# Patient Record
Sex: Female | Born: 1935 | Race: Black or African American | Hispanic: No | Smoking: Former smoker
Health system: Southern US, Community
[De-identification: ages and names within clinical notes are randomized; demographics above are authoritative.]

## PROBLEM LIST (undated history)

## (undated) DIAGNOSIS — I10 Essential (primary) hypertension: Secondary | ICD-10-CM

## (undated) DIAGNOSIS — E785 Hyperlipidemia, unspecified: Secondary | ICD-10-CM

## (undated) HISTORY — PX: TOTAL HIP ARTHROPLASTY: SHX124

---

## 2017-05-10 ENCOUNTER — Emergency Department: Payer: Medicare Other

## 2017-05-10 ENCOUNTER — Emergency Department
Admission: EM | Admit: 2017-05-10 | Discharge: 2017-05-10 | Disposition: A | Discharge status: Home / Self Care | Payer: Medicare Other | Attending: Emergency Medicine | Admitting: Emergency Medicine

## 2017-05-10 ENCOUNTER — Encounter: Payer: Self-pay | Admitting: Intensive Care

## 2017-05-10 DIAGNOSIS — Z7982 Long term (current) use of aspirin: Secondary | ICD-10-CM | POA: Insufficient documentation

## 2017-05-10 DIAGNOSIS — M79601 Pain in right arm: Secondary | ICD-10-CM | POA: Insufficient documentation

## 2017-05-10 DIAGNOSIS — I10 Essential (primary) hypertension: Secondary | ICD-10-CM | POA: Diagnosis not present

## 2017-05-10 DIAGNOSIS — Z79899 Other long term (current) drug therapy: Secondary | ICD-10-CM | POA: Diagnosis not present

## 2017-05-10 DIAGNOSIS — Z87891 Personal history of nicotine dependence: Secondary | ICD-10-CM | POA: Diagnosis not present

## 2017-05-10 HISTORY — DX: Hyperlipidemia, unspecified: E78.5

## 2017-05-10 HISTORY — DX: Essential (primary) hypertension: I10

## 2017-05-10 LAB — CBC
HCT: 38.5 % (ref 35.0–47.0)
Hemoglobin: 13 g/dL (ref 12.0–16.0)
MCH: 31.6 pg (ref 26.0–34.0)
MCHC: 33.9 g/dL (ref 32.0–36.0)
MCV: 93.2 fL (ref 80.0–100.0)
Platelets: 218 10*3/uL (ref 150–440)
RBC: 4.13 MIL/uL (ref 3.80–5.20)
RDW: 13.5 % (ref 11.5–14.5)
WBC: 4.1 10*3/uL (ref 3.6–11.0)

## 2017-05-10 LAB — BASIC METABOLIC PANEL WITH GFR
Anion gap: 8 (ref 5–15)
BUN: 17 mg/dL (ref 6–20)
CO2: 24 mmol/L (ref 22–32)
Calcium: 9 mg/dL (ref 8.9–10.3)
Chloride: 109 mmol/L (ref 101–111)
Creatinine, Ser: 0.86 mg/dL (ref 0.44–1.00)
GFR calc Af Amer: >60 mL/min
GFR calc non Af Amer: 60 mL/min — ABNORMAL LOW
Glucose, Bld: 97 mg/dL (ref 65–99)
Potassium: 3.6 mmol/L (ref 3.5–5.1)
Sodium: 141 mmol/L (ref 135–145)

## 2017-05-10 LAB — TROPONIN I
Troponin I: 0.03 ng/mL
Troponin I: <0.03 ng/mL

## 2017-05-10 MED ORDER — TRAMADOL HCL 50 MG PO TABS: 50.0000 mg | ORAL_TABLET | Freq: Four times a day (QID) | ORAL | 0 refills | Status: DC | PRN

## 2017-05-10 MED ORDER — MORPHINE SULFATE (PF) 2 MG/ML IV SOLN
2.0000 mg | Freq: Once | INTRAVENOUS | Status: AC
  Administered 2017-05-10: 2 mg via INTRAVENOUS
  Filled 2017-05-10: qty 1

## 2017-05-10 MED ORDER — ASPIRIN 81 MG PO CHEW
324.0000 mg | CHEWABLE_TABLET | Freq: Once | ORAL | Status: AC
  Administered 2017-05-10: 324 mg via ORAL
  Filled 2017-05-10: qty 4

## 2017-05-10 MED ORDER — ONDANSETRON HCL 4 MG/2ML IJ SOLN
4.0000 mg | Freq: Once | INTRAMUSCULAR | Status: AC
  Administered 2017-05-10: 4 mg via INTRAVENOUS
  Filled 2017-05-10: qty 2

## 2017-05-10 MED ORDER — ACETAMINOPHEN 325 MG PO TABS
650.0000 mg | ORAL_TABLET | Freq: Once | ORAL | Status: AC
  Administered 2017-05-10: 650 mg via ORAL
  Filled 2017-05-10: qty 2

## 2017-05-10 NOTE — ED Triage Notes (Signed)
Patient arrived by EMS from home. PAtient was awoke from sleep with pain in R arm, dizziness, and nausea. A&O x4. EMS vitals  Blood pressure 197/109, blood sugar 124. Patient takes aspirin daily

## 2017-05-10 NOTE — Discharge Instructions (Addendum)
Although no certain cause was found for your right arm pain, your exam and evaluation are overall reassuring in the emergency department today.  Return to the emergency department immediately for any worsening arm pain, skin rash, weakness, numbness, tingling, passing out, chest pain, trouble breathing, fever, or any other symptoms concerning to you.

## 2017-05-10 NOTE — ED Notes (Signed)
Patient declined recheck of VS prior to discharge.

## 2017-05-10 NOTE — ED Provider Notes (Signed)
Discharge papers and tramadol prescription misplaced - I did go ahead and reprint so the patient can be discharged.   Governor Rooks, MD 05/10/17 1537

## 2017-05-10 NOTE — ED Provider Notes (Signed)
[facility] Emergency Department Provider Note ____________________________________________   I have reviewed the triage vital signs and the triage nursing note.  HISTORY  Chief Complaint Arm Pain (right)   Historian Patient  HPI Kelly Richards is a 83 y.o. female with history of htn and hyperlipidemia presents with complaint of waking up this morning an hour or two ago with right top of the arm pain -- seems to be the upper arm and shoulder, although not particularly tender or worse with palpation or range of motion.  Hx of MI 20 years ago, does not follow cardiology and does take  asa, but not today.  Pain 10-10.  Denies overuse injury.  No skin rash.  No chest pain or sob.    Past Medical History:  Diagnosis Date  . Hyperlipidemia   . Hypertension     There are no active problems to display for this patient.   History reviewed. No pertinent surgical history.  Prior to Admission medications   Medication Sig Start Date End Date Taking? Authorizing Provider  aspirin EC 81 MG tablet Take 81 mg by mouth daily.   Yes [provider]  atorvastatin (LIPITOR) 20 MG tablet Take 20 mg by mouth daily.   Yes [provider]  verapamil (VERELAN PM) 360 MG 24 hr capsule Take 360 mg by mouth at bedtime.   Yes [provider]    No Known Allergies  History reviewed. No pertinent family history.  Social History Social History  Substance Use Topics  . Smoking status: Former Games developer  . Smokeless tobacco: Never Used  . Alcohol use No    Review of Systems  Constitutional: Negative for fever. Eyes: Negative for visual changes. ENT: Negative for sore throat. Cardiovascular: Negative for chest pain. Respiratory: Negative for shortness of breath. Gastrointestinal: Negative for abdominal pain, vomiting and diarrhea. Genitourinary: Negative for dysuria. Musculoskeletal: Negative for back pain. Skin: Negative for  rash. Neurological: Negative for headache.  ____________________________________________   PHYSICAL EXAM:  VITAL SIGNS: ED Triage Vitals  Enc Vitals Group     BP 05/10/17 0754 (!) 178/97     Pulse Rate 05/10/17 0754 65     Resp 05/10/17 0754 15     Temp 05/10/17 0754 97.8 F (36.6 C)     Temp Source 05/10/17 0754 Oral     SpO2 05/10/17 0754 98 %     Weight 05/10/17 0754 165 lb (74.8 kg)     Height 05/10/17 0754  (1.6 m)     Head Circumference --      Peak Flow --      Pain Score 05/10/17 0753 10     Pain Loc --      Pain Edu? --      Excl. in GC? --      Constitutional: Alert and oriented. Well appearing and in no distress. HEENT   Head: Normocephalic and atraumatic.      Eyes: Conjunctivae are normal. Pupils equal and round.       Ears:         Nose: No congestion/rhinnorhea.   Mouth/Throat: Mucous membranes are moist.   Neck: No stridor. Cardiovascular/Chest: Normal rate, regular rhythm.  No murmurs, rubs, or gallops.  Nontender chest wall to anterior and lateral compression and palpation. Respiratory: Normal respiratory effort without tachypnea nor retractions. Breath sounds are clear and equal bilaterally. No wheezes/rales/rhonchi. Gastrointestinal: Soft. No distention, no guarding, no rebound. Nontender. Genitourinary/rectal:Deferred Musculoskeletal: Right trapezius and biceps and triceps area  tender although no obvious muscle spasm noted.  No pain with range of motion of the right upper extremity or neck.  No joint effusions.  No lower extremity tenderness.  Very trace le edema. Neurologic:  Normal speech and language. No gross or focal neurologic deficits are appreciated. Skin:  Skin is warm, dry and intact. No rash noted. Psychiatric: Mood and affect are normal. Speech and behavior are normal. Patient exhibits appropriate insight and judgment.   ____________________________________________  LABS (pertinent positives/negatives) I, Governor Rooks,  MD the attending physician have reviewed the labs noted below.  Labs Reviewed  BASIC METABOLIC PANEL - Abnormal; Notable for the following:       Result Value   GFR calc non Af Amer 60 (*)    All other components within normal limits  TROPONIN I - Abnormal; Notable for the following:    Troponin I 0.03 (*)    All other components within normal limits  CBC  TROPONIN I    ____________________________________________    EKG I, Governor Rooks, MD, the attending physician have personally viewed and interpreted all ECGs.  68 bpm.  NSR.  Narrow qrs.  Nl axis.  T waves inverted inf and laterally.  LVH with repol changes.  No prior ECG for comparison. ____________________________________________  RADIOLOGY All Xrays were viewed by me.  Imaging interpreted by Radiologist, and I, Governor Rooks, MD the attending physician have reviewed the radiologist interpretation noted below.  CXR:  IMPRESSION: No active cardiopulmonary disease. No evidence of pneumonia or pulmonary edema.  Large hiatal hernia.  Aortic atherosclerosis. __________________________________________  PROCEDURES  Procedure(s) performed: None  Critical Care performed: None  ____________________________________________   ED COURSE / ASSESSMENT AND PLAN  Pertinent labs & imaging results that were available during my care of the patient were reviewed by me and considered in my medical decision making (see chart for details).   Complaint of right shoulder pain seems most likely to be inflammatory such as musculo or nerve pain, with normal range of motion and exam other than pain at that right trapezius and upper arm. No swelling - think dvt unlikely.  Good pulses, think vascular occlusion unlikely.  Hx of CAD and other risk factors but location seems less likely ACS.  ECG with lvh and some tw inversions, but no prior for comparison.  Disucssed with pt and daughter 4 hour recheck for cardiac.  Trying aspirin and  tylenol initially for  Pain relief.  Pt reports 10-10 pain, but looks really comfortable, am trying non narcotic before going to narcotic options.  patient was still complaining of severe pain and was given 2 mg of morphine and after that reported relief of pain.  I will prescribe tramadol prescription if over-the-counter Tylenol and/or ibuprofen or not providing adequate home relief.  Repeat troponin is reviewed and reassuring and negative at less than 0.03.  CONSULTATIONS: None  Patient / Family / Caregiver informed of clinical course, medical decision-making process, and agree with plan.   I discussed return precautions, follow-up instructions, and discharge instructions with patient and/or family.  Discharge Instructions :Although no certain cause was found for your right arm pain, your exam and evaluation are overall reassuring in the emergency department today.  Return to the emergency department immediately for any worsening arm pain, skin rash, weakness, numbness, tingling, passing out, chest pain, trouble breathing, fever, or any other symptoms concerning to you.  ___________________________________________   FINAL CLINICAL IMPRESSION(S) / ED DIAGNOSES   Final diagnoses:  Right arm pain  Note: This dictation was prepared with Dragon dictation. Any transcriptional errors that result from this process are unintentional    Governor Rooks, MD 05/10/17 1313

## 2017-12-15 ENCOUNTER — Other Ambulatory Visit: Payer: Self-pay

## 2017-12-15 ENCOUNTER — Ambulatory Visit
Admission: EM | Admit: 2017-12-15 | Discharge: 2017-12-15 | Disposition: A | Discharge status: Home / Self Care | Payer: Medicare Other | Attending: Family Medicine | Admitting: Family Medicine

## 2017-12-15 ENCOUNTER — Encounter: Payer: Self-pay | Admitting: Emergency Medicine

## 2017-12-15 DIAGNOSIS — J40 Bronchitis, not specified as acute or chronic: Secondary | ICD-10-CM | POA: Diagnosis not present

## 2017-12-15 DIAGNOSIS — R5383 Other fatigue: Secondary | ICD-10-CM | POA: Diagnosis not present

## 2017-12-15 DIAGNOSIS — R059 Cough, unspecified: Secondary | ICD-10-CM

## 2017-12-15 DIAGNOSIS — R05 Cough: Secondary | ICD-10-CM

## 2017-12-15 MED ORDER — IPRATROPIUM-ALBUTEROL 0.5-2.5 (3) MG/3ML IN SOLN
3.0000 mL | Freq: Once | RESPIRATORY_TRACT | Status: AC
  Administered 2017-12-15: 3 mL via RESPIRATORY_TRACT

## 2017-12-15 MED ORDER — BENZONATATE 200 MG PO CAPS: 200.0000 mg | ORAL_CAPSULE | Freq: Three times a day (TID) | ORAL | 0 refills | Status: DC | PRN

## 2017-12-15 MED ORDER — DOXYCYCLINE HYCLATE 100 MG PO TABS: 100.0000 mg | ORAL_TABLET | Freq: Two times a day (BID) | ORAL | 0 refills | Status: DC

## 2017-12-15 NOTE — ED Provider Notes (Signed)
[facility]    CSN: [id number] Arrival date & time: 12/15/17  1713     History   Chief Complaint Chief Complaint  Patient presents with  . Cough    HPI Kelly Richards is a 84 y.o. female.   The history is provided by the patient.  URI  Presenting symptoms: cough and fatigue   Severity:  Moderate Onset quality:  Sudden Duration:  5 days Timing:  Constant Progression:  Worsening Chronicity:  New Relieved by:  Nothing Ineffective treatments:  OTC medications Associated symptoms: no sinus pain and no wheezing   Risk factors: chronic respiratory disease (former chronic smoker)   Risk factors: not elderly, no chronic cardiac disease, no chronic kidney disease, no immunosuppression, no recent illness and no recent travel     Past Medical History:  Diagnosis Date  . Hyperlipidemia   . Hypertension     There are no active problems to display for this patient.   Past Surgical History:  Procedure Laterality Date  . TOTAL HIP ARTHROPLASTY      OB History   None      Home Medications    Prior to Admission medications   Medication Sig Start Date End Date Taking? Authorizing Provider  aspirin EC 81 MG tablet Take 81 mg by mouth daily.   Yes [provider]  atorvastatin (LIPITOR) 20 MG tablet Take 20 mg by mouth daily.   Yes [provider]  verapamil (VERELAN PM) 360 MG 24 hr capsule Take 360 mg by mouth at bedtime.   Yes [provider]  benzonatate (TESSALON) 200 MG capsule Take 1 capsule (200 mg total) by mouth 3 (three) times daily as needed. 12/15/17   Payton Mccallum, MD  doxycycline (VIBRA-TABS) 100 MG tablet Take 1 tablet (100 mg total) by mouth 2 (two) times daily. 12/15/17   Payton Mccallum, MD  traMADol (ULTRAM) 50 MG tablet Take 1 tablet (50 mg total) by mouth every 6 (six) hours as needed for severe pain. 05/10/17   Governor Rooks, MD    Family History Family History  Problem Relation Age of Onset  . Cancer Father      Social History Social History   Tobacco Use  . Smoking status: Former Games developer  . Smokeless tobacco: Never Used  Substance Use Topics  . Alcohol use: Yes  . Drug use: No     Allergies   Patient has no known allergies.   Review of Systems Review of Systems  Constitutional: Positive for fatigue.  HENT: Negative for sinus pain.   Respiratory: Positive for cough. Negative for wheezing.      Physical Exam Triage Vital Signs ED Triage Vitals  Enc Vitals Group     BP 12/15/17 1733 (!) 160/106     Pulse Rate 12/15/17 1733 (!) 109     Resp 12/15/17 1727 16     Temp 12/15/17 1727 99.8 F (37.7 C)     Temp Source 12/15/17 1727 Oral     SpO2 12/15/17 1733 97 %     Weight 12/15/17 1727 165 lb (74.8 kg)     Height 12/15/17 1727  (1.676 m)     Head Circumference --      Peak Flow --      Pain Score 12/15/17 1726 10     Pain Loc --      Pain Edu? --      Excl. in GC? --    No data found.  Updated Vital Signs  BP (!) 160/106 (BP Location: Right Arm)   Pulse (!) 109   Temp 99.8 F (37.7 C) (Oral)   Resp 16   Ht  (1.676 m)   Wt 165 lb (74.8 kg)   SpO2 98%   BMI 26.63 kg/m   Visual Acuity Right Eye Distance:   Left Eye Distance:   Bilateral Distance:    Right Eye Near:   Left Eye Near:    Bilateral Near:     Physical Exam  Constitutional: She appears well-developed and well-nourished. No distress.  HENT:  Head: Normocephalic and atraumatic.  Nose: Mucosal edema and rhinorrhea present. No nose lacerations, sinus tenderness, nasal deformity, septal deviation or nasal septal hematoma. No epistaxis.  No foreign bodies. Right sinus exhibits maxillary sinus tenderness and frontal sinus tenderness. Left sinus exhibits maxillary sinus tenderness and frontal sinus tenderness.  Mouth/Throat: Uvula is midline, oropharynx is clear and moist and mucous membranes are normal. No oropharyngeal exudate.  Neck: Normal range of motion. Neck supple. No thyromegaly  present.  Cardiovascular: Normal rate, regular rhythm and normal heart sounds.  Pulmonary/Chest: Effort normal. No respiratory distress. She has wheezes (few expiratory and rhonchi diffusely). She has no rales.  Lymphadenopathy:    She has no cervical adenopathy.  Skin: She is not diaphoretic.  Nursing note and vitals reviewed.    UC Treatments / Results  Labs (all labs ordered are listed, but only abnormal results are displayed) Labs Reviewed - No data to display  EKG None  Radiology No results found.  Procedures Procedures (including critical care time)  Medications Ordered in UC Medications  ipratropium-albuterol (DUONEB) 0.5-2.5 (3) MG/3ML nebulizer solution 3 mL (3 mLs Nebulization Given 12/15/17 1753)    Initial Impression / Assessment and Plan / UC Course  I have reviewed the triage vital signs and the nursing notes.  Pertinent labs & imaging results that were available during my care of the patient were reviewed by me and considered in my medical decision making (see chart for details).      Final Clinical Impressions(s) / UC Diagnoses   Final diagnoses:  Cough  Bronchitis    ED Prescriptions    Medication Sig Dispense Auth. Provider   doxycycline (VIBRA-TABS) 100 MG tablet Take 1 tablet (100 mg total) by mouth 2 (two) times daily. 20 tablet Payton Mccallum, MD   benzonatate (TESSALON) 200 MG capsule Take 1 capsule (200 mg total) by mouth 3 (three) times daily as needed. 30 capsule Payton Mccallum, MD     1. diagnosis reviewed with patient; given duoneb with improvement of symptoms 2. rx as per orders above; reviewed possible side effects, interactions, risks and benefits  3. Follow-up prn if symptoms worsen or don't improve   Controlled Substance Prescriptions [place] Controlled Substance Registry consulted? Not Applicable   Payton Mccallum, MD 12/15/17 1843

## 2017-12-15 NOTE — ED Triage Notes (Signed)
Patient states she developed a very bad cough yesterday, chest pain and shortness of breath

## 2018-04-27 ENCOUNTER — Other Ambulatory Visit: Payer: Self-pay | Admitting: Family Medicine

## 2018-04-27 DIAGNOSIS — Z780 Asymptomatic menopausal state: Secondary | ICD-10-CM

## 2018-05-05 ENCOUNTER — Inpatient Hospital Stay: Admission: RE | Admit: 2018-05-05 | Payer: Medicare Other | Source: Ambulatory Visit

## 2018-05-11 ENCOUNTER — Other Ambulatory Visit: Payer: Medicare Other

## 2018-05-13 ENCOUNTER — Ambulatory Visit
Admission: RE | Admit: 2018-05-13 | Discharge: 2018-05-13 | Disposition: A | Discharge status: Home / Self Care | Payer: Medicare Other | Source: Ambulatory Visit | Attending: Family Medicine | Admitting: Family Medicine

## 2018-05-13 DIAGNOSIS — Z780 Asymptomatic menopausal state: Secondary | ICD-10-CM | POA: Insufficient documentation

## 2018-06-05 ENCOUNTER — Other Ambulatory Visit: Payer: Self-pay

## 2018-06-05 ENCOUNTER — Observation Stay
Admission: EM | Admit: 2018-06-05 | Discharge: 2018-06-06 | Disposition: A | Discharge status: Home / Self Care | Payer: Medicare Other | Attending: Internal Medicine | Admitting: Internal Medicine

## 2018-06-05 ENCOUNTER — Emergency Department: Payer: Medicare Other

## 2018-06-05 DIAGNOSIS — F0280 Dementia in other diseases classified elsewhere without behavioral disturbance: Secondary | ICD-10-CM | POA: Diagnosis not present

## 2018-06-05 DIAGNOSIS — I700 Atherosclerosis of aorta: Secondary | ICD-10-CM | POA: Diagnosis not present

## 2018-06-05 DIAGNOSIS — R079 Chest pain, unspecified: Secondary | ICD-10-CM | POA: Diagnosis present

## 2018-06-05 DIAGNOSIS — Z888 Allergy status to other drugs, medicaments and biological substances status: Secondary | ICD-10-CM | POA: Diagnosis not present

## 2018-06-05 DIAGNOSIS — R0789 Other chest pain: Principal | ICD-10-CM | POA: Insufficient documentation

## 2018-06-05 DIAGNOSIS — I10 Essential (primary) hypertension: Secondary | ICD-10-CM | POA: Diagnosis not present

## 2018-06-05 DIAGNOSIS — G309 Alzheimer's disease, unspecified: Secondary | ICD-10-CM | POA: Diagnosis not present

## 2018-06-05 DIAGNOSIS — K449 Diaphragmatic hernia without obstruction or gangrene: Secondary | ICD-10-CM | POA: Diagnosis not present

## 2018-06-05 DIAGNOSIS — E782 Mixed hyperlipidemia: Secondary | ICD-10-CM

## 2018-06-05 DIAGNOSIS — Z87891 Personal history of nicotine dependence: Secondary | ICD-10-CM | POA: Diagnosis not present

## 2018-06-05 DIAGNOSIS — K219 Gastro-esophageal reflux disease without esophagitis: Secondary | ICD-10-CM | POA: Diagnosis not present

## 2018-06-05 DIAGNOSIS — Z7982 Long term (current) use of aspirin: Secondary | ICD-10-CM | POA: Insufficient documentation

## 2018-06-05 DIAGNOSIS — Z79899 Other long term (current) drug therapy: Secondary | ICD-10-CM | POA: Insufficient documentation

## 2018-06-05 DIAGNOSIS — I6529 Occlusion and stenosis of unspecified carotid artery: Secondary | ICD-10-CM | POA: Diagnosis not present

## 2018-06-05 DIAGNOSIS — E785 Hyperlipidemia, unspecified: Secondary | ICD-10-CM | POA: Diagnosis not present

## 2018-06-05 DIAGNOSIS — R011 Cardiac murmur, unspecified: Secondary | ICD-10-CM

## 2018-06-05 DIAGNOSIS — I739 Peripheral vascular disease, unspecified: Secondary | ICD-10-CM

## 2018-06-05 DIAGNOSIS — F0390 Unspecified dementia without behavioral disturbance: Secondary | ICD-10-CM

## 2018-06-05 LAB — CBC
HCT: 38.9 % (ref 36.0–46.0)
Hemoglobin: 12.6 g/dL (ref 12.0–15.0)
MCH: 30.2 pg (ref 26.0–34.0)
MCHC: 32.4 g/dL (ref 30.0–36.0)
MCV: 93.3 fL (ref 80.0–100.0)
Platelets: 236 10*3/uL (ref 150–400)
RBC: 4.17 MIL/uL (ref 3.87–5.11)
RDW: 13.2 % (ref 11.5–15.5)
WBC: 5.6 10*3/uL (ref 4.0–10.5)
nRBC: 0 % (ref 0.0–0.2)

## 2018-06-05 LAB — BASIC METABOLIC PANEL
Anion gap: 6 (ref 5–15)
BUN: 17 mg/dL (ref 8–23)
CO2: 24 mmol/L (ref 22–32)
Calcium: 9.1 mg/dL (ref 8.9–10.3)
Chloride: 109 mmol/L (ref 98–111)
Creatinine, Ser: 0.9 mg/dL (ref 0.44–1.00)
GFR calc Af Amer: >60 mL/min (ref >60)
GFR calc non Af Amer: 56 mL/min — ABNORMAL LOW (ref >60)
Glucose, Bld: 107 mg/dL — ABNORMAL HIGH (ref 70–99)
Potassium: 3.5 mmol/L (ref 3.5–5.1)
Sodium: 139 mmol/L (ref 135–145)

## 2018-06-05 LAB — TROPONIN I
Troponin I: <0.03 ng/mL (ref <0.03)
Troponin I: <0.03 ng/mL (ref <0.03)
Troponin I: <0.03 ng/mL (ref <0.03)
Troponin I: <0.03 ng/mL (ref <0.03)

## 2018-06-05 MED ORDER — ENOXAPARIN SODIUM 40 MG/0.4ML ~~LOC~~ SOLN
40.0000 mg | SUBCUTANEOUS | Status: DC
  Administered 2018-06-05: 40 mg via SUBCUTANEOUS
  Filled 2018-06-05: qty 0.4

## 2018-06-05 MED ORDER — TRAMADOL HCL 50 MG PO TABS
50.0000 mg | ORAL_TABLET | Freq: Once | ORAL | Status: AC
  Administered 2018-06-05: 50 mg via ORAL
  Filled 2018-06-05: qty 1

## 2018-06-05 MED ORDER — ONDANSETRON HCL 4 MG/2ML IJ SOLN
4.0000 mg | Freq: Four times a day (QID) | INTRAMUSCULAR | Status: DC | PRN
  Administered 2018-06-05: 4 mg via INTRAVENOUS
  Filled 2018-06-05: qty 2

## 2018-06-05 MED ORDER — INFLUENZA VAC SPLIT HIGH-DOSE 0.5 ML IM SUSY
0.5000 mL | PREFILLED_SYRINGE | INTRAMUSCULAR | Status: DC
  Filled 2018-06-05: qty 0.5

## 2018-06-05 MED ORDER — ACETAMINOPHEN 325 MG PO TABS: 650.0000 mg | ORAL_TABLET | ORAL | Status: DC | PRN

## 2018-06-05 MED ORDER — GI COCKTAIL ~~LOC~~
30.0000 mL | Freq: Four times a day (QID) | ORAL | Status: DC | PRN
  Filled 2018-06-05: qty 30

## 2018-06-05 MED ORDER — NITROGLYCERIN 0.4 MG SL SUBL: 0.4000 mg | SUBLINGUAL_TABLET | SUBLINGUAL | Status: DC | PRN

## 2018-06-05 MED ORDER — VERAPAMIL HCL ER 180 MG PO TBCR
360.0000 mg | EXTENDED_RELEASE_TABLET | Freq: Every day | ORAL | Status: DC
  Administered 2018-06-05: 360 mg via ORAL
  Filled 2018-06-05 (×2): qty 2

## 2018-06-05 MED ORDER — ASPIRIN EC 325 MG PO TBEC
325.0000 mg | DELAYED_RELEASE_TABLET | Freq: Every day | ORAL | Status: DC
  Administered 2018-06-05 – 2018-06-06 (×2): 325 mg via ORAL
  Filled 2018-06-05 (×2): qty 1

## 2018-06-05 MED ORDER — METOPROLOL TARTRATE 25 MG PO TABS
12.5000 mg | ORAL_TABLET | Freq: Two times a day (BID) | ORAL | Status: DC
  Administered 2018-06-05: 12.5 mg via ORAL
  Filled 2018-06-05: qty 1

## 2018-06-05 MED ORDER — PANTOPRAZOLE SODIUM 40 MG PO TBEC
40.0000 mg | DELAYED_RELEASE_TABLET | Freq: Every day | ORAL | Status: DC
  Administered 2018-06-05 – 2018-06-06 (×2): 40 mg via ORAL
  Filled 2018-06-05 (×2): qty 1

## 2018-06-05 MED ORDER — ATORVASTATIN CALCIUM 20 MG PO TABS
20.0000 mg | ORAL_TABLET | Freq: Every day | ORAL | Status: DC
  Administered 2018-06-05 – 2018-06-06 (×2): 20 mg via ORAL
  Filled 2018-06-05 (×2): qty 1

## 2018-06-05 MED ORDER — MORPHINE SULFATE (PF) 2 MG/ML IV SOLN: 2.0000 mg | INTRAVENOUS | Status: DC | PRN

## 2018-06-05 NOTE — H&P (Signed)
[facility] [place] [facility]   PATIENT NAME: Kelly Richards    MR#:  [id number]  DATE OF BIRTH:  [birth year]  DATE OF ADMISSION:  06/05/2018  PRIMARY CARE PHYSICIAN: Titus Mould, NP   REQUESTING/REFERRING PHYSICIAN:   CHIEF COMPLAINT:   Chief Complaint  Patient presents with  . Chest Pain    HISTORY OF PRESENT ILLNESS: Kelly Richards  is a 84 y.o. female with a known history per below which also includes Alzheimer's disease, coronary artery disease, presents with acute chest pain that awoke patient from sleep around 4 AM associated with shortness of breath, ER work-up was unimpressive, patient evaluated in the emergency room, daughter at the bedside, patient is a poor historian due to dementia, patient now be admitted for acute atypical chest pain.  PAST MEDICAL HISTORY:   Past Medical History:  Diagnosis Date  . Hyperlipidemia   . Hypertension     PAST SURGICAL HISTORY:  Past Surgical History:  Procedure Laterality Date  . TOTAL HIP ARTHROPLASTY      SOCIAL HISTORY:  Social History   Tobacco Use  . Smoking status: Former Games developer  . Smokeless tobacco: Never Used  Substance Use Topics  . Alcohol use: Yes    FAMILY HISTORY:  Family History  Problem Relation Age of Onset  . Cancer Father     DRUG ALLERGIES:  Allergies  Allergen Reactions  . Lisinopril Cough    REVIEW OF SYSTEMS: Difficult to obtain given dementia  CONSTITUTIONAL: No fever, fatigue or weakness.  EYES: No blurred or double vision.  EARS, NOSE, AND THROAT: No tinnitus or ear pain.  RESPIRATORY: No cough, shortness of breath, wheezing or hemoptysis.  CARDIOVASCULAR: + chest pain,no orthopnea, edema.  GASTROINTESTINAL: No nausea, vomiting, diarrhea or abdominal pain.  GENITOURINARY: No dysuria, hematuria.  ENDOCRINE: No polyuria, nocturia,  HEMATOLOGY: No anemia, easy bruising or bleeding SKIN: No rash or lesion. MUSCULOSKELETAL: No joint pain or arthritis.    NEUROLOGIC: No tingling, numbness, weakness.  PSYCHIATRY: No anxiety or depression.   MEDICATIONS AT HOME:  Prior to Admission medications   Medication Sig Start Date End Date Taking? Authorizing Provider  aspirin EC 81 MG tablet Take 81 mg by mouth daily.   Yes [provider]  atorvastatin (LIPITOR) 20 MG tablet Take 20 mg by mouth daily.   Yes [provider]  meloxicam (MOBIC) 15 MG tablet Take 15 mg by mouth daily. 04/16/18  Yes [provider]  verapamil (VERELAN PM) 360 MG 24 hr capsule Take 360 mg by mouth at bedtime.   Yes [provider]      PHYSICAL EXAMINATION:   VITAL SIGNS: Blood pressure 118/75, pulse (!) 58, temperature 98.2 F (36.8 C), temperature source Oral, resp. rate (!) 21, height 5\' 6"  (1.676 m), weight 74.8 kg, SpO2 96 %.  GENERAL:  84 y.o.-year-old patient lying in the bed with no acute distress.  Obese, frail-appearing EYES: Pupils equal, round, reactive to light and accommodation. No scleral icterus. Extraocular muscles intact.  HEENT: Head atraumatic, normocephalic. Oropharynx and nasopharynx clear.  NECK:  Supple, no jugular venous distention. No thyroid enlargement, no tenderness.  LUNGS: Normal breath sounds bilaterally, no wheezing, rales,rhonchi or crepitation. No use of accessory muscles of respiration.  CARDIOVASCULAR: S1, S2 normal. No murmurs, rubs, or gallops.  ABDOMEN: Soft, nontender, nondistended. Bowel sounds present. No organomegaly or mass.  EXTREMITIES: No pedal edema, cyanosis, or clubbing.  NEUROLOGIC: Cranial nerves II through XII are intact. Muscle strength 5/5  in all extremities. Sensation intact. Gait not checked.  PSYCHIATRIC: The patient is confused and disoriented at baseline  SKIN: No obvious rash, lesion, or ulcer.   LABORATORY PANEL:   CBC Recent Labs  Lab 06/05/18 0951  WBC 5.6  HGB 12.6  HCT 38.9  PLT 236  MCV 93.3  MCH 30.2  MCHC 32.4  RDW 13.2    ------------------------------------------------------------------------------------------------------------------  Chemistries  Recent Labs  Lab 06/05/18 0951  NA 139  K 3.5  CL 109  CO2 24  GLUCOSE 107*  BUN 17  CREATININE 0.90  CALCIUM 9.1   ------------------------------------------------------------------------------------------------------------------ estimated creatinine clearance is 46.4 mL/min (by C-G formula based on SCr of 0.9 mg/dL). ------------------------------------------------------------------------------------------------------------------ No results for input(s): TSH, T4TOTAL, T3FREE, THYROIDAB in the last 72 hours.  Invalid input(s): FREET3   Coagulation profile No results for input(s): INR, PROTIME in the last 168 hours. ------------------------------------------------------------------------------------------------------------------- No results for input(s): DDIMER in the last 72 hours. -------------------------------------------------------------------------------------------------------------------  Cardiac Enzymes Recent Labs  Lab 06/05/18 0951 06/05/18 1240  TROPONINI <0.03 <0.03   ------------------------------------------------------------------------------------------------------------------ Invalid input(s): POCBNP  ---------------------------------------------------------------------------------------------------------------  Urinalysis No results found for: COLORURINE, APPEARANCEUR, LABSPEC, PHURINE, GLUCOSEU, HGBUR, BILIRUBINUR, KETONESUR, PROTEINUR, UROBILINOGEN, NITRITE, LEUKOCYTESUR   RADIOLOGY: Dg Chest 2 View  Result Date: 06/05/2018 CLINICAL DATA:  Chest pain. Shortness of breath. Former smoker. EXAM: CHEST - 2 VIEW COMPARISON:  05/10/2017 FINDINGS: The patient is mildly rotated to the right with grossly unchanged cardiomediastinal silhouette. A large hiatal hernia and aortic atherosclerosis are again noted. Lung volumes are  lower than on the prior study, and there is mild streaky opacity in the left lung base. No pleural effusion or pneumothorax is identified. Retained temporary epicardial pacing wires are again seen. No acute osseous abnormality is identified. IMPRESSION: 1. Low lung volumes with mild left basilar atelectasis. 2. Large hiatal hernia. Electronically Signed   By: Sebastian Ache M.D.   On: 06/05/2018 10:38    EKG: Orders placed or performed during the hospital encounter of 06/05/18  . EKG 12-Lead  . EKG 12-Lead  . ED EKG within 10 minutes  . ED EKG within 10 minutes  . EKG 12-Lead  . EKG 12-Lead  . EKG 12-Lead  . EKG 12-Lead    IMPRESSION AND PLAN: *Acute atypical chest pain Admit to regular nursing floor bed with telemetry on our ACS protocol, rule out acute cord syndrome cardiac enzymes x3 sets, aspirin, statin therapy, check lipids in the morning, beta-blocker therapy, nitrates as needed, IV morphine PRN breakthrough pain, supplemental oxygen wean as tolerated, if rules out will proceed with nuclear medicine stress testing in the morning  *Chronic dementia Stable Increase nursing care PRN, aspiration/fall/skin care precautions while in house  *Chronic GERD without esophagitis PPI daily  *Chronic benign essential hypertension Stable Continue verapamil, started on low-dose beta-blocker    All the records are reviewed and case discussed with ED provider. Management plans discussed with the patient, family and they are in agreement.  CODE STATUS:full    TOTAL TIME TAKING CARE OF THIS PATIENT: 40 minutes.    Evelena Asa Salary M.D on 06/05/2018   Between 7am to 6pm - Pager - [phone]  After 6pm go to [email or web address] - Social research officer, government  [facility]  Office  [phone]  CC: Primary care physician; White, Arlyss Repress, NP   Note: This dictation was prepared with Dragon dictation along with smaller phrase technology. Any transcriptional errors that  result from this process are unintentional.

## 2018-06-05 NOTE — Consult Note (Signed)
Cardiology Consultation:   Patient ID: Kelly Richards MRN: [id number]; DOB: Aug 17, 1930  Admit date: 06/05/2018 Date of Consult: 06/05/2018  Primary Care Provider: Titus Mould, NP Primary Cardiologist: New to [facility] Reason for consult: Arm pain, shortness of breath Physician requesting consult: Dr. Katheren Shams  Patient Profile:   Kelly Richards is a 84 y.o. female with a hx of hypertension, hyperlipidemia, carotid stenosis, large hiatal hernia by CT scan , aortic atherosclerosis by CT , who presents to the hospital with bilateral arm pain, shortness of breath   History of Present Illness:   [name] reports symptoms started several weeks to months ago, Seems to happen once a month She will develop unilateral arm pain and typically rubs her arm until symptoms resolve Last night developed bilateral arm pain at rest, woke her from sleep Symptoms seem to get better for a short period of time and went back to sleep but then woke up again with bilateral arm pain symptoms Worse that she has had so far, unable to get the pain to go away by rubbing her arm so she presented to the hospital She did call her family to let them know  Typically not very active, no regular exercise program Denies having any chest pain on exertion  EKG from emergency room evaluated personally by myself showing normal sinus rhythm with T wave abnormality precordial leads concerning for ischemia  Outside records reviewed showing moderate carotid disease on ultrasound February 2018 50-69% maximum diameter stenosis estimated in the right internal carotid artery.   Previous CT abdomen pelvis February 2018 with large hiatal hernia Aortic atherosclerosis including branches   Stress echocardiogram April 2015 Report indicates mildly abnormal stress echo T wave inversions noted at that time at rest   Past Medical History:  Diagnosis Date  . Hyperlipidemia   . Hypertension     Past Surgical History:    Procedure Laterality Date  . TOTAL HIP ARTHROPLASTY       Home Medications:  Prior to Admission medications   Medication Sig Start Date End Date Taking? Authorizing Provider  aspirin EC 81 MG tablet Take 81 mg by mouth daily.   Yes [provider]  atorvastatin (LIPITOR) 20 MG tablet Take 20 mg by mouth daily.   Yes [provider]  meloxicam (MOBIC) 15 MG tablet Take 15 mg by mouth daily. 04/16/18  Yes [provider]  verapamil (VERELAN PM) 360 MG 24 hr capsule Take 360 mg by mouth at bedtime.   Yes [provider]    Inpatient Medications: Scheduled Meds: . aspirin EC  325 mg Oral Daily  . atorvastatin  20 mg Oral Daily  . enoxaparin (LOVENOX) injection  40 mg Subcutaneous Q24H  . [START ON 06/06/2018] Influenza vac split quadrivalent PF  0.5 mL Intramuscular Tomorrow-1000  . metoprolol tartrate  12.5 mg Oral BID  . pantoprazole  40 mg Oral Daily  . verapamil  360 mg Oral QHS   Continuous Infusions:  PRN Meds: acetaminophen, gi cocktail, morphine injection, nitroGLYCERIN, ondansetron (ZOFRAN) IV  Allergies:    Allergies  Allergen Reactions  . Lisinopril Cough    Social History:   Social History   Socioeconomic History  . Marital status: Widowed    Spouse name: Not on file  . Number of children: Not on file  . Years of education: Not on file  . Highest education level: Not on file  Occupational History  . Not on file  Social Needs  . Financial resource strain: Not  on file  . Food insecurity:    Worry: Not on file    Inability: Not on file  . Transportation needs:    Medical: Not on file    Non-medical: Not on file  Tobacco Use  . Smoking status: Former Games developer  . Smokeless tobacco: Never Used  Substance and Sexual Activity  . Alcohol use: Yes  . Drug use: No  . Sexual activity: Not Currently  Lifestyle  . Physical activity:    Days per week: Not on file    Minutes per session: Not on file  . Stress: Not on file   Relationships  . Social connections:    Talks on phone: Not on file    Gets together: Not on file    Attends religious service: Not on file    Active member of club or organization: Not on file    Attends meetings of clubs or organizations: Not on file    Relationship status: Not on file  . Intimate partner violence:    Fear of current or ex partner: Not on file    Emotionally abused: Not on file    Physically abused: Not on file    Forced sexual activity: Not on file  Other Topics Concern  . Not on file  Social History Narrative  . Not on file    Family History:    Family History  Problem Relation Age of Onset  . Cancer Father      ROS:  Please see the history of present illness.  Review of Systems  Constitutional: Negative.   Respiratory: Negative.   Cardiovascular:       Bilateral arm pain  Gastrointestinal: Negative.   Musculoskeletal: Negative.   Neurological: Negative.   Psychiatric/Behavioral: Negative.   All other systems reviewed and are negative.   Physical Exam/Data:   Vitals:   06/05/18 0952 06/05/18 0959 06/05/18 1530 06/05/18 1651  BP:   132/82 (!) 146/86  Pulse:   (!) 52 (!) 55  Resp:   14 19  Temp: 98.2 F (36.8 C)   97.7 F (36.5 C)  TempSrc: Oral   Oral  SpO2:   96% 99%  Weight:  74.8 kg    Height:  5\' 6"  (1.676 m)     No intake or output data in the 24 hours ending 06/05/18 1917 Filed Weights   06/05/18 0959  Weight: 74.8 kg   Body mass index is 26.62 kg/m.  General:  Well nourished, well developed, in no acute distress HEENT: normal Lymph: no adenopathy Neck: no JVD Endocrine:  No thryomegaly Vascular: No carotid bruits; FA pulses 2+ bilaterally without bruits  Cardiac:  normal S1, S2; RRR; no murmur  Lungs:  clear to auscultation bilaterally, no wheezing, rhonchi or rales  Abd: soft, nontender, no hepatomegaly  Ext: no edema Musculoskeletal:  No deformities, BUE and BLE strength normal and equal Skin: warm and dry  Neuro:   CNs 2-12 intact, no focal abnormalities noted Psych:  Normal affect   EKG:  The EKG was personally reviewed and demonstrates: As detailed above, normal sinus rhythm with T wave inversions in anterolateral leads Telemetry:  Telemetry was personally reviewed and demonstrates: Normal sinus rhythm  Relevant CV Studies: Stress test pending  Laboratory Data:  Chemistry Recent Labs  Lab 06/05/18 0951  NA 139  K 3.5  CL 109  CO2 24  GLUCOSE 107*  BUN 17  CREATININE 0.90  CALCIUM 9.1  GFRNONAA 56*  GFRAA >60  ANIONGAP 6  No results for input(s): PROT, ALBUMIN, AST, ALT, ALKPHOS, BILITOT in the last 168 hours. Hematology Recent Labs  Lab 06/05/18 0951  WBC 5.6  RBC 4.17  HGB 12.6  HCT 38.9  MCV 93.3  MCH 30.2  MCHC 32.4  RDW 13.2  PLT 236   Cardiac Enzymes Recent Labs  Lab 06/05/18 0951 06/05/18 1240 06/05/18 1702  TROPONINI <0.03 <0.03 <0.03   No results for input(s): TROPIPOC in the last 168 hours.  BNPNo results for input(s): BNP, PROBNP in the last 168 hours.  DDimer No results for input(s): DDIMER in the last 168 hours.  Radiology/Studies:  Dg Chest 2 View  Result Date: 06/05/2018 CLINICAL DATA:  Chest pain. Shortness of breath. Former smoker. EXAM: CHEST - 2 VIEW COMPARISON:  05/10/2017 FINDINGS: The patient is mildly rotated to the right with grossly unchanged cardiomediastinal silhouette. A large hiatal hernia and aortic atherosclerosis are again noted. Lung volumes are lower than on the prior study, and there is mild streaky opacity in the left lung base. No pleural effusion or pneumothorax is identified. Retained temporary epicardial pacing wires are again seen. No acute osseous abnormality is identified. IMPRESSION: 1. Low lung volumes with mild left basilar atelectasis. 2. Large hiatal hernia. Electronically Signed   By: Sebastian Ache M.D.   On: 06/05/2018 10:38    Assessment and Plan:   1. Bilateral arm pain Reports having no significant chest  pain Stuttering, and worsening bilateral arm pain presenting at rest She does have risk factors for coronary artery disease including PAD/carotid stenosis/aortic atherosclerosis Also with abnormal EKG, unable to treadmill -Pharmacologic Myoview has been ordered by hospitalist service Procedure discussed with Ms. Hammond in detail, she is willing to proceed If stress test shows no ischemia, unable to exclude other etiology for her symptoms such as arthritis/DJD, large hiatal hernia -Would decrease aspirin down to 81 mg daily  2) dementia No family at the bedside to discuss details Notes reviewed  3) chronic GERD,  On PPI  4) PAD,  Carotid stenosis, aortic atherosclerosis Low-dose aspirin, statin  5) hypertension Continue verapamil, Hospitalist service has started beta-blocker (will likely not be able to tolerate given bradycardia)   Total encounter time more than 110 minutes  Greater than 50% was spent in counseling and coordination of care with the patient   For questions or updates, please contact [facility] Please consult [email or web address] for contact info under     Signed, Julien Nordmann, MD  06/05/2018 7:17 PM

## 2018-06-05 NOTE — Progress Notes (Signed)
Family Meeting Note  Advance Directive:yes  Today a meeting took place with the Patient, daughter.  Patient is able to participate   The following clinical team members were present during this meeting:MD  The following were discussed:Patient's diagnosis:cad, dementia , Patient's progosis: Unable to determine and Goals for treatment: Full Code  Additional follow-up to be provided: prn  Time spent during discussion:20 minutes  Bertrum Sol, MD

## 2018-06-05 NOTE — Progress Notes (Signed)
Chaplain responded to an OR for an AD. Pt is alert and oriented. Chaplain educated Pt on AD and practiced active listening and pastoral presence. Family and career were discussed and prayer was given. Pt may be D/C tomorrow. Chaplain let Pt know AD can be done outside of hospital.    06/05/18 1700  Clinical Encounter Type  Visited With Patient  Visit Type Initial  Referral From Nurse  Spiritual Encounters  Spiritual Needs Brochure;Prayer

## 2018-06-05 NOTE — ED Notes (Signed)
Report to Ashley, RN

## 2018-06-05 NOTE — ED Provider Notes (Signed)
[facility] Emergency Department Provider Note    First MD Initiated Contact with Patient 06/05/18 [id number]     (approximate)  I have reviewed the triage vital signs and the nursing notes.   HISTORY  Chief Complaint Chest Pain    HPI Kelly Richards is a 84 y.o. female presents the ER with bilateral arm pain and midsternal chest pain associated with some shortness of breath.  Patient does have a history of cardiac disease but does not remember her cardiologist or what her cardiac history is.  States the pain woke her up from sleep around 4 AM.  States it is sharp and stabbing in nature.  Pain has eased off but is still there.  Denies any diaphoresis or nausea with this.  No cough.  No fevers.  No flank pain.  No pain ripping or tearing through to her back.    Past Medical History:  Diagnosis Date  . Hyperlipidemia   . Hypertension    Family History  Problem Relation Age of Onset  . Cancer Father    Past Surgical History:  Procedure Laterality Date  . TOTAL HIP ARTHROPLASTY     Patient Active Problem List   Diagnosis Date Noted  . Chest pain 06/05/2018      Prior to Admission medications   Medication Sig Start Date End Date Taking? Authorizing Provider  aspirin EC 81 MG tablet Take 81 mg by mouth daily.   Yes [provider]  atorvastatin (LIPITOR) 20 MG tablet Take 20 mg by mouth daily.   Yes [provider]  meloxicam (MOBIC) 15 MG tablet Take 15 mg by mouth daily. 04/16/18  Yes [provider]  verapamil (VERELAN PM) 360 MG 24 hr capsule Take 360 mg by mouth at bedtime.   Yes [provider]    Allergies Lisinopril    Social History Social History   Tobacco Use  . Smoking status: Former Games developer  . Smokeless tobacco: Never Used  Substance Use Topics  . Alcohol use: Yes  . Drug use: No    Review of Systems Patient denies headaches, rhinorrhea, blurry vision, numbness, shortness of breath, chest pain,  edema, cough, abdominal pain, nausea, vomiting, diarrhea, dysuria, fevers, rashes or hallucinations unless otherwise stated above in HPI. ____________________________________________   PHYSICAL EXAM:  VITAL SIGNS: Vitals:   06/05/18 0952 06/05/18 1530  BP:  132/82  Pulse:  (!) 52  Resp:  14  Temp: 98.2 F (36.8 C)   SpO2:  96%    Constitutional: Alert and oriented.  Eyes: Conjunctivae are normal.  Head: Atraumatic. Nose: No congestion/rhinnorhea. Mouth/Throat: Mucous membranes are moist.   Neck: No stridor. Painless ROM.  Cardiovascular: Normal rate, regular rhythm. Grossly normal heart sounds.  Good peripheral circulation. Respiratory: Normal respiratory effort.  No retractions. Lungs CTAB. Gastrointestinal: Soft and nontender. No distention. No abdominal bruits. No CVA tenderness. Genitourinary:  Musculoskeletal: No lower extremity tenderness nor edema.  No joint effusions. Neurologic:  Normal speech and language. No gross focal neurologic deficits are appreciated. No facial droop Skin:  Skin is warm, dry and intact. No rash noted. Psychiatric: Mood and affect are normal. Speech and behavior are normal.  ____________________________________________   LABS (all labs ordered are listed, but only abnormal results are displayed)  Results for orders placed or performed during the hospital encounter of 06/05/18 (from the past 24 hour(s))  Basic metabolic panel     Status: Abnormal   Collection Time: 06/05/18  9:51 AM  Result  Value Ref Range   Sodium 139 135 - 145 mmol/L   Potassium 3.5 3.5 - 5.1 mmol/L   Chloride 109 98 - 111 mmol/L   CO2 24 22 - 32 mmol/L   Glucose, Bld 107 (H) 70 - 99 mg/dL   BUN 17 8 - 23 mg/dL   Creatinine, Ser 1.61 0.44 - 1.00 mg/dL   Calcium 9.1 8.9 - 09.6 mg/dL   GFR calc non Af Amer 56 (L) >60 mL/min   GFR calc Af Amer >60 >60 mL/min   Anion gap 6 5 - 15  CBC     Status: None   Collection Time: 06/05/18  9:51 AM  Result Value Ref Range   WBC  5.6 4.0 - 10.5 K/uL   RBC 4.17 3.87 - 5.11 MIL/uL   Hemoglobin 12.6 12.0 - 15.0 g/dL   HCT 04.5 40.9 - 81.1 %   MCV 93.3 80.0 - 100.0 fL   MCH 30.2 26.0 - 34.0 pg   MCHC 32.4 30.0 - 36.0 g/dL   RDW 91.4 78.2 - 95.6 %   Platelets 236 150 - 400 K/uL   nRBC 0.0 0.0 - 0.2 %  Troponin I     Status: None   Collection Time: 06/05/18  9:51 AM  Result Value Ref Range   Troponin I <0.03 <0.03 ng/mL  Troponin I     Status: None   Collection Time: 06/05/18 12:40 PM  Result Value Ref Range   Troponin I <0.03 <0.03 ng/mL   ____________________________________________  EKG My review and personal interpretation at Time: 9:41   Indication: chest pain  Rate: 60  Rhythm: sinus Axis: normal Other: anterolateral t wave inversions, no stemi ____________________________________________  RADIOLOGY  I personally reviewed all radiographic images ordered to evaluate for the above acute complaints and reviewed radiology reports and findings.  These findings were personally discussed with the patient.  Please see medical record for radiology report.  ____________________________________________   PROCEDURES  Procedure(s) performed:  Procedures    Critical Care performed: no ____________________________________________   INITIAL IMPRESSION / ASSESSMENT AND PLAN / ED COURSE  Pertinent labs & imaging results that were available during my care of the patient were reviewed by me and considered in my medical decision making (see chart for details).   DDX: ACS, pericarditis, esophagitis, boerhaaves, pe, dissection, pna, bronchitis, costochondritis   Kelly Richards is a 84 y.o. who presents to the ED with pain discomfort as described above.  Patient does have new cardiac murmur as noted.  No recent echo looks explain etiology.  Patient is AFVSS in ED. Exam as above. Given current presentation have considered the above differential.  EKG with inferolateral ST T wave abnormalities.  Initial troponin is  negative.  Does not seem clinically consistent with dissection or PE.  Clinical Course as of Jun 06 1599  Fri Jun 05, 2018  1402 Enzymes remain negative patient's not having any pain right now but she does have worsening exertional dyspnea on reexamination patient does have holosystolic crescendo decrescendo murmur concerning for aortic stenosis.  This may be contributing to her symptomatology and given her chest pain earlier with abnormal EKG with anterolateral ST changes do feel patient would benefit from cardiac work-up and admission.   [PR]    Clinical Course User Index [PR] Willy Eddy, MD     As part of my medical decision making, I reviewed the following data within the electronic MEDICAL RECORD NUMBER Nursing notes reviewed and incorporated, Labs reviewed, notes from prior  ED visits .   ____________________________________________   FINAL CLINICAL IMPRESSION(S) / ED DIAGNOSES  Final diagnoses:  Chest pain, unspecified type  Murmur, cardiac      NEW MEDICATIONS STARTED DURING THIS VISIT:  New Prescriptions   No medications on file     Note:  This document was prepared using Dragon voice recognition software and may include unintentional dictation errors.    Willy Eddy, MD 06/05/18 1600

## 2018-06-05 NOTE — ED Triage Notes (Signed)
Pt arrives via [facility] from home c/o bilateral arm pain. States "my arms hurt a lot, but today was different it made me short of breath and I am worried about my heart." Protocol initiated.

## 2018-06-06 ENCOUNTER — Encounter: Payer: Self-pay | Admitting: Internal Medicine

## 2018-06-06 ENCOUNTER — Observation Stay (HOSPITAL_BASED_OUTPATIENT_CLINIC_OR_DEPARTMENT_OTHER): Payer: Medicare Other

## 2018-06-06 DIAGNOSIS — R079 Chest pain, unspecified: Secondary | ICD-10-CM | POA: Diagnosis not present

## 2018-06-06 DIAGNOSIS — R0789 Other chest pain: Secondary | ICD-10-CM | POA: Diagnosis not present

## 2018-06-06 LAB — NM MYOCAR MULTI W/SPECT W/WALL MOTION / EF
Estimated workload: 1 METS
Exercise duration (min): 1 min
LV dias vol: 37 mL (ref 46–106)
LV sys vol: 11 mL
MPHR: 134 {beats}/min
Peak HR: 59 {beats}/min
Percent HR: 44 %
Rest HR: 47 {beats}/min
SDS: 0
SRS: 16
SSS: 7
TID: 0.95

## 2018-06-06 LAB — LIPID PANEL
Cholesterol: 146 mg/dL (ref 0–200)
HDL: 50 mg/dL (ref >40)
LDL Cholesterol: 84 mg/dL (ref 0–99)
Total CHOL/HDL Ratio: 2.9 RATIO
Triglycerides: 58 mg/dL (ref <150)
VLDL: 12 mg/dL (ref 0–40)

## 2018-06-06 LAB — TROPONIN I: Troponin I: <0.03 ng/mL (ref <0.03)

## 2018-06-06 MED ORDER — TECHNETIUM TC 99M TETROFOSMIN IV KIT
11.2000 | PACK | Freq: Once | INTRAVENOUS | Status: AC | PRN
  Administered 2018-06-06: 11.2 via INTRAVENOUS

## 2018-06-06 MED ORDER — TECHNETIUM TC 99M TETROFOSMIN IV KIT
33.3000 | PACK | Freq: Once | INTRAVENOUS | Status: AC | PRN
  Administered 2018-06-06: 33.3 via INTRAVENOUS

## 2018-06-06 MED ORDER — REGADENOSON 0.4 MG/5ML IV SOLN
0.4000 mg | Freq: Once | INTRAVENOUS | Status: AC
  Administered 2018-06-06: 0.4 mg via INTRAVENOUS

## 2018-06-06 NOTE — Care Management Obs Status (Signed)
MEDICARE OBSERVATION STATUS NOTIFICATION   Patient Details  Name: Kelly Richards MRN: [id number] Date of Birth: 10/27/1929   Medicare Observation Status Notification Given:  Yes    Kaio Kuhlman A Sigmund Morera, RN 06/06/2018, 11:38 AM

## 2018-06-06 NOTE — Progress Notes (Signed)
Progress Note  Patient Name: Kelly Richards Date of Encounter: 06/06/2018  Primary Cardiologist: new (seen by [facility])  Subjective   The patient was seen during nuclear stress test.  She complains of right arm discomfort but no chest pain or left arm pain.  Inpatient Medications    Scheduled Meds: . aspirin EC  325 mg Oral Daily  . atorvastatin  20 mg Oral Daily  . enoxaparin (LOVENOX) injection  40 mg Subcutaneous Q24H  . Influenza vac split quadrivalent PF  0.5 mL Intramuscular Tomorrow-1000  . metoprolol tartrate  12.5 mg Oral BID  . pantoprazole  40 mg Oral Daily  . verapamil  360 mg Oral QHS   Continuous Infusions:  PRN Meds: acetaminophen, gi cocktail, morphine injection, nitroGLYCERIN, ondansetron (ZOFRAN) IV   Vital Signs    Vitals:   06/05/18 1530 06/05/18 1651 06/05/18 1954 06/06/18 0357  BP: 132/82 (!) 146/86 (!) 137/98 (!) 116/58  Pulse: (!) 52 (!) 55 84 (!) 52  Resp: 14 19 18 14   Temp:  97.7 F (36.5 C) (!) 97.5 F (36.4 C) 97.7 F (36.5 C)  TempSrc:  Oral Oral Oral  SpO2: 96% 99% 94% 94%  Weight:      Height:        Intake/Output Summary (Last 24 hours) at 06/06/2018 0948 Last data filed at 06/06/2018 0535 Gross per 24 hour  Intake -  Output 450 ml  Net -450 ml   Filed Weights   06/05/18 0959  Weight: 74.8 kg    Telemetry    Sinus rhythm with sinus bradycardia- Personally Reviewed  ECG     - Personally Reviewed  Physical Exam   GEN: No acute distress.   Neck: No JVD Cardiac: RRR, no  rubs, or gallops.  2 out of 6 systolic murmur in the aortic area Respiratory: Clear to auscultation bilaterally. GI: Soft, nontender, non-distended  MS: No edema; No deformity. Neuro:  Nonfocal  Psych: Normal affect   Labs    Chemistry Recent Labs  Lab 06/05/18 0951  NA 139  K 3.5  CL 109  CO2 24  GLUCOSE 107*  BUN 17  CREATININE 0.90  CALCIUM 9.1  GFRNONAA 56*  GFRAA >60  ANIONGAP 6     Hematology Recent Labs  Lab  06/05/18 0951  WBC 5.6  RBC 4.17  HGB 12.6  HCT 38.9  MCV 93.3  MCH 30.2  MCHC 32.4  RDW 13.2  PLT 236    Cardiac Enzymes Recent Labs  Lab 06/05/18 1240 06/05/18 1702 06/05/18 2011 06/05/18 2303  TROPONINI <0.03 <0.03 <0.03 <0.03   No results for input(s): TROPIPOC in the last 168 hours.   BNPNo results for input(s): BNP, PROBNP in the last 168 hours.   DDimer No results for input(s): DDIMER in the last 168 hours.   Radiology    Dg Chest 2 View  Result Date: 06/05/2018 CLINICAL DATA:  Chest pain. Shortness of breath. Former smoker. EXAM: CHEST - 2 VIEW COMPARISON:  05/10/2017 FINDINGS: The patient is mildly rotated to the right with grossly unchanged cardiomediastinal silhouette. A large hiatal hernia and aortic atherosclerosis are again noted. Lung volumes are lower than on the prior study, and there is mild streaky opacity in the left lung base. No pleural effusion or pneumothorax is identified. Retained temporary epicardial pacing wires are again seen. No acute osseous abnormality is identified. IMPRESSION: 1. Low lung volumes with mild left basilar atelectasis. 2. Large hiatal hernia. Electronically Signed   By: Freida Busman  Mosetta Putt M.D.   On: 06/05/2018 10:38    Cardiac Studies   Nuclear stress test is pending  Patient Profile     84 y.o. female with a hx of hypertension, hyperlipidemia, carotid stenosis, large hiatal hernia by CT scan , aortic atherosclerosis by CT , who presents to the hospital with bilateral arm pain, shortness of breath  Assessment & Plan    1.  Bilateral arm pain: Symptoms are atypical for cardiac ischemia with the patient has multiple risk factors for coronary artery disease and has an abnormal baseline EKG.  She underwent a Lexiscan Myoview this morning.  If stress test is unremarkable, the patient can be discharged home from a cardiac standpoint. Given her age and dementia, any invasive work-up should be reserved for high risk stress test.  2.   History of carotid stenosis and aortic atherosclerosis: Currently on low-dose aspirin and statin.  3.  Essential hypertension: Blood pressure is controlled but the patient is bradycardic and she is on both metoprolol and verapamil.  I elected to discontinue metoprolol.  If bradycardia continues, consider decreasing the dose of verapamil.       For questions or updates, please contact [facility] Please consult [email or web address] for contact info under        Signed, Lorine Bears, MD  06/06/2018, 9:48 AM

## 2018-06-06 NOTE — Plan of Care (Signed)

## 2018-06-06 NOTE — Discharge Instructions (Signed)
Resume diet and activity as before ° ° °

## 2018-06-10 NOTE — Discharge Summary (Signed)
[facility]   PATIENT NAME: Kelly Richards    MR#:  [id number]  DATE OF BIRTH:  Sep 24, 1929  DATE OF ADMISSION:  06/05/2018 ADMITTING PHYSICIAN: Bertrum Sol, MD  DATE OF DISCHARGE: 06/06/2018  6:10 PM  PRIMARY CARE PHYSICIAN: Titus Mould, NP   ADMISSION DIAGNOSIS:  Murmur, cardiac [R01.1] Chest pain, unspecified type [R07.9]  DISCHARGE DIAGNOSIS:  Active Problems:   Chest pain   SECONDARY DIAGNOSIS:   Past Medical History:  Diagnosis Date  . Hyperlipidemia   . Hypertension      ADMITTING HISTORY  HISTORY OF PRESENT ILLNESS: Kelly Richards  is a 84 y.o. female with a known history per below which also includes Alzheimer's disease, coronary artery disease, presents with acute chest pain that awoke patient from sleep around 4 AM associated with shortness of breath, ER work-up was unimpressive, patient evaluated in the emergency room, daughter at the bedside, patient is a poor historian due to dementia, patient now be admitted for acute atypical chest pain.  HOSPITAL COURSE:   *Chest and arm pain. Patient was admitted to telemetry floor.  No arrhythmias on telemetry found.  Troponins remain normal.  Due to risk factors patient was scheduled for a Myoview stress test which was low risk without any reversible ischemia found.  Patient was seen by cardiology who I discussed the case with on the day of discharge. Patient likely has neuropathy or muscle pain from her presentation.  We will follow-up with her primary care physician and continue using pain medications as needed.  Tylenol or ibuprofen.  Discussed with family with regarding the same.  CONSULTS OBTAINED:  Treatment Team:  Antonieta Iba, MD  DRUG ALLERGIES:   Allergies  Allergen Reactions  . Lisinopril Cough    DISCHARGE MEDICATIONS:   Allergies as of 06/06/2018      Reactions   Lisinopril Cough      Medication List    TAKE these medications    aspirin EC 81 MG tablet Take 81 mg by mouth daily.   atorvastatin 20 MG tablet Commonly known as:  LIPITOR Take 20 mg by mouth daily.   meloxicam 15 MG tablet Commonly known as:  MOBIC Take 15 mg by mouth daily.   verapamil 360 MG 24 hr capsule Commonly known as:  VERELAN PM Take 360 mg by mouth at bedtime.       Today   VITAL SIGNS:  Blood pressure 136/79, pulse (!) 58, temperature 97.9 F (36.6 C), temperature source Oral, resp. rate 16, height 5\' 6"  (1.676 m), weight 74.8 kg, SpO2 99 %.  I/O:  No intake or output data in the 24 hours ending 06/10/18 1903  PHYSICAL EXAMINATION:  Physical Exam  GENERAL:  84 y.o.-year-old patient lying in the bed with no acute distress.  LUNGS: Normal breath sounds bilaterally, no wheezing, rales,rhonchi or crepitation. No use of accessory muscles of respiration.  CARDIOVASCULAR: S1, S2 normal. No murmurs, rubs, or gallops.  ABDOMEN: Soft, non-tender, non-distended. Bowel sounds present. No organomegaly or mass.  NEUROLOGIC: Moves all 4 extremities. PSYCHIATRIC: The patient is alert and oriented x 3.  SKIN: No obvious rash, lesion, or ulcer.   DATA REVIEW:   CBC Recent Labs  Lab 06/05/18 0951  WBC 5.6  HGB 12.6  HCT 38.9  PLT 236    Chemistries  Recent Labs  Lab 06/05/18 0951  NA 139  K 3.5  CL 109  CO2 24  GLUCOSE 107*  BUN 17  CREATININE 0.90  CALCIUM 9.1    Cardiac Enzymes Recent Labs  Lab 06/05/18 2303  TROPONINI <0.03    Microbiology Results  No results found for this or any previous visit.  RADIOLOGY:  No results found.  Follow up with PCP in 1 week.  Management plans discussed with the patient, family and they are in agreement.  CODE STATUS:  Code Status History    Date Active Date Inactive Code Status Order ID Comments User Context   06/05/2018 1654 06/06/2018 2123 Full Code [id number]  Salary, Evelena Asa, MD Inpatient      TOTAL TIME TAKING CARE OF THIS PATIENT ON DAY OF DISCHARGE: more  than 30 minutes.   Molinda Bailiff Sabin Gibeault M.D on 06/10/2018 at 7:03 PM  Between 7am to 6pm - Pager - [phone]  After 6pm go to [email or web address] - password [email or web address] [facility]  [facility]  Office  [phone]  CC: Primary care physician; White, Arlyss Repress, NP  Note: This dictation was prepared with Dragon dictation along with smaller phrase technology. Any transcriptional errors that result from this process are unintentional.

## 2018-08-29 ENCOUNTER — Encounter: Payer: Self-pay | Admitting: Emergency Medicine

## 2018-08-29 ENCOUNTER — Emergency Department: Payer: Medicare Other

## 2018-08-29 ENCOUNTER — Emergency Department
Admission: EM | Admit: 2018-08-29 | Discharge: 2018-08-29 | Disposition: A | Discharge status: Home / Self Care | Payer: Medicare Other | Attending: Emergency Medicine | Admitting: Emergency Medicine

## 2018-08-29 ENCOUNTER — Other Ambulatory Visit: Payer: Self-pay

## 2018-08-29 DIAGNOSIS — M79643 Pain in unspecified hand: Secondary | ICD-10-CM | POA: Diagnosis not present

## 2018-08-29 DIAGNOSIS — M79609 Pain in unspecified limb: Secondary | ICD-10-CM

## 2018-08-29 DIAGNOSIS — Z79899 Other long term (current) drug therapy: Secondary | ICD-10-CM | POA: Insufficient documentation

## 2018-08-29 DIAGNOSIS — Z87891 Personal history of nicotine dependence: Secondary | ICD-10-CM | POA: Insufficient documentation

## 2018-08-29 DIAGNOSIS — I10 Essential (primary) hypertension: Secondary | ICD-10-CM | POA: Insufficient documentation

## 2018-08-29 DIAGNOSIS — Z7982 Long term (current) use of aspirin: Secondary | ICD-10-CM | POA: Diagnosis not present

## 2018-08-29 LAB — BASIC METABOLIC PANEL
Anion gap: 6 (ref 5–15)
BUN: 16 mg/dL (ref 8–23)
CO2: 26 mmol/L (ref 22–32)
Calcium: 9 mg/dL (ref 8.9–10.3)
Chloride: 109 mmol/L (ref 98–111)
Creatinine, Ser: 1.03 mg/dL — ABNORMAL HIGH (ref 0.44–1.00)
GFR calc Af Amer: 57 mL/min — ABNORMAL LOW (ref >60)
GFR calc non Af Amer: 49 mL/min — ABNORMAL LOW (ref >60)
Glucose, Bld: 95 mg/dL (ref 70–99)
Potassium: 3.5 mmol/L (ref 3.5–5.1)
Sodium: 141 mmol/L (ref 135–145)

## 2018-08-29 LAB — CBC
HCT: 37.4 % (ref 36.0–46.0)
Hemoglobin: 12 g/dL (ref 12.0–15.0)
MCH: 29.8 pg (ref 26.0–34.0)
MCHC: 32.1 g/dL (ref 30.0–36.0)
MCV: 92.8 fL (ref 80.0–100.0)
Platelets: 226 10*3/uL (ref 150–400)
RBC: 4.03 MIL/uL (ref 3.87–5.11)
RDW: 12.9 % (ref 11.5–15.5)
WBC: 4.6 10*3/uL (ref 4.0–10.5)
nRBC: 0 % (ref 0.0–0.2)

## 2018-08-29 LAB — TROPONIN I
Troponin I: <0.03 ng/mL (ref <0.03)
Troponin I: <0.03 ng/mL (ref <0.03)

## 2018-08-29 NOTE — ED Provider Notes (Addendum)
[facility] Emergency Department Provider Note  ____________________________________________   I have reviewed the triage vital signs and the nursing notes. Where available I have reviewed prior notes and, if possible and indicated, outside hospital notes.    HISTORY  Chief Complaint Hand Pain    HPI Kelly Richards is a 85 y.o. female with a history of hyperlipidemia and hypertension, states that for the last several months has been having pain "only when I sleep" in both hands.  Patient does have history of arthritis she states.  She was seen and evaluated for something very similar to this in October was admitted had a negative stress test and was deemed to have noncardiac symptoms.  She denies any chest pain or shortness of breath.  She denies any exertional symptoms.  Patient states it only happens when she sleeps.  Is an aching discomfort.  No numbness no weakness.  No neck pain or injury.  This is been going on she states for months.  Patient does suffer from some degree of mild dementia according to EMS but is at her baseline. She states she is not having the pain right now.   Past Medical History:  Diagnosis Date  . Hyperlipidemia   . Hypertension     Patient Active Problem List   Diagnosis Date Noted  . Chest pain 06/05/2018    Past Surgical History:  Procedure Laterality Date  . TOTAL HIP ARTHROPLASTY      Prior to Admission medications   Medication Sig Start Date End Date Taking? Authorizing Provider  aspirin EC 81 MG tablet Take 81 mg by mouth daily.    [provider]  atorvastatin (LIPITOR) 20 MG tablet Take 20 mg by mouth daily.    [provider]  meloxicam (MOBIC) 15 MG tablet Take 15 mg by mouth daily. 04/16/18   [provider]  verapamil (VERELAN PM) 360 MG 24 hr capsule Take 360 mg by mouth at bedtime.    [provider]    Allergies Lisinopril  Family History  Problem Relation Age of Onset   . Cancer Father     Social History Social History   Tobacco Use  . Smoking status: Former Games developer  . Smokeless tobacco: Never Used  Substance Use Topics  . Alcohol use: Yes  . Drug use: No    Review of Systems Constitutional: No fever/chills Eyes: No visual changes. ENT: No sore throat. No stiff neck no neck pain Cardiovascular: Denies chest pain. Respiratory: Denies shortness of breath. Gastrointestinal:   no vomiting.  No diarrhea.  No constipation. Genitourinary: Negative for dysuria. Musculoskeletal: Negative lower extremity swelling Skin: Negative for rash. Neurological: Negative for severe headaches, focal weakness or numbness.   ____________________________________________   PHYSICAL EXAM:  VITAL SIGNS: ED Triage Vitals  Enc Vitals Group     BP 08/29/18 1150 (!) 156/84     Pulse Rate 08/29/18 1150 65     Resp 08/29/18 1150 18     Temp 08/29/18 1150 98.1 F (36.7 C)     Temp src --      SpO2 08/29/18 1150 94 %     Weight 08/29/18 1151 169 lb (76.7 kg)     Height 08/29/18 1151 5\' 6"  (1.676 m)     Head Circumference --      Peak Flow --      Pain Score 08/29/18 1151 0     Pain Loc --      Pain Edu? --  Excl. in GC? --     Constitutional: Alert and oriented. Well appearing and in no acute distress. Eyes: Conjunctivae are normal Head: Atraumatic HEENT: No congestion/rhinnorhea. Mucous membranes are moist.  Oropharynx non-erythematous Neck:   Nontender with no meningismus, no masses, no stridor Cardiovascular: Normal rate, regular rhythm. Grossly normal heart sounds.  Good peripheral circulation. Respiratory: Normal respiratory effort.  No retractions. Lungs CTAB. Abdominal: Soft and nontender. No distention. No guarding no rebound Back:  There is no focal tenderness or step off.  there is no midline tenderness there are no lesions noted. there is no CVA tenderness Musculoskeletal: No lower extremity tenderness, no upper extremity tenderness. No  joint effusions, no DVT signs strong distal pulses no edema Neurologic:  Normal speech and language. No gross focal neurologic deficits are appreciated.  Skin:  Skin is warm, dry and intact. No rash noted. Psychiatric: Mood and affect are normal. Speech and behavior are normal.  ____________________________________________   LABS (all labs ordered are listed, but only abnormal results are displayed)  Labs Reviewed  CBC  BASIC METABOLIC PANEL  TROPONIN I    Pertinent labs  results that were available during my care of the patient were reviewed by me and considered in my medical decision making (see chart for details). ____________________________________________  EKG  I personally interpreted any EKGs ordered by me or triage Sinus rhythm, rate 65, there are flipped T waves laterally which are present on old EKG, no acute ST elevation or depression or change from prior ____________________________________________  RADIOLOGY  Pertinent labs & imaging results that were available during my care of the patient were reviewed by me and considered in my medical decision making (see chart for details). If possible, patient and/or family made aware of any abnormal findings.  No results found. ____________________________________________    PROCEDURES  Procedure(s) performed: None  Procedures  Critical Care performed: None  ____________________________________________   INITIAL IMPRESSION / ASSESSMENT AND PLAN / ED COURSE  Pertinent labs & imaging results that were available during my care of the patient were reviewed by me and considered in my medical decision making (see chart for details).  Here with hand pain when she sleeps.  Not reproducible at this time she is not suffering from at this time it only happens when he sleeps and she is been suffering from it for months.  There is no evidence of acute coronary syndrome she does not have any chest pain she does not have any  shortness of breath unless given her age we are going EKG troponin and basic metabolic panel and chest x-ray.  Multiple other possible causes for her symptoms exist including radiculopathy but there is no indication for acute imaging and she has no weakness.  Also considered is arthritis, there is nothing to suggest compartment syndrome, there is nothing to suggest that this is referred in thoracic pathology at this time given its history or exam.  ----------------------------------------- 1:54 PM on 08/29/2018 -----------------------------------------  She is pain free is watching TV in no acute distress   _____________  ----------------------------------------- 2:14 PM on 08/29/2018 ----------------------------------------- Patient in no acute distress still with no pain I did talk to family they do endorse that only night she complained about these hand pains, and this is a chronic recurrent symptom for at least 3 to 6 months.  I do not see any reason to admit her to the hospital and family very strongly would prefer to come pick her up and bring her home.  They  do agree to let her stay for the second troponin      _______________________________   FINAL CLINICAL IMPRESSION(S) / ED DIAGNOSES  Final diagnoses:  None      This chart was dictated using voice recognition software.  Despite best efforts to proofread,  errors can occur which can change meaning.      Jeanmarie PlantMcShane, Samyak Sackmann A, MD 08/29/18 1239    Jeanmarie PlantMcShane, Maykayla Highley A, MD 08/29/18 1354    Jeanmarie PlantMcShane, Roberts Bon A, MD 08/29/18 1415    Jeanmarie PlantMcShane, Jeidi Gilles A, MD 08/29/18 [phone]

## 2018-08-29 NOTE — Discharge Instructions (Addendum)
It is unclear why her hands hurt when you sleep.  However we are reassured by your work-up today.  Please follow-up with your primary care doctor in the next 2 days to talk to her about this.  In addition, consider taking a Tylenol before going to bed.  Of increased pain, numbness, weakness or other concerns return to the ER

## 2018-08-29 NOTE — ED Triage Notes (Signed)
PT to ER via EMS from home with c/o bilateral arm pain since 3AM when it woke her from sleep.  PT is described as constant and stabbing.  Pt has been evaluated for similar symptoms twice before.  Pt denies chest pain, SHOB, or other symptoms at this time.

## 2018-08-29 NOTE — ED Notes (Signed)
Pt aware she is ready for d/c, contacting ride at this time.  Will await for their arrival.

## 2020-02-19 ENCOUNTER — Other Ambulatory Visit: Payer: Self-pay

## 2020-02-19 ENCOUNTER — Emergency Department
Admission: EM | Admit: 2020-02-19 | Discharge: 2020-02-20 | Disposition: A | Discharge status: Other Acute Inpatient Hospital | Payer: Medicare PPO | Attending: Emergency Medicine | Admitting: Emergency Medicine

## 2020-02-19 ENCOUNTER — Emergency Department: Payer: Medicare PPO

## 2020-02-19 DIAGNOSIS — K311 Adult hypertrophic pyloric stenosis: Secondary | ICD-10-CM | POA: Insufficient documentation

## 2020-02-19 DIAGNOSIS — R112 Nausea with vomiting, unspecified: Secondary | ICD-10-CM | POA: Diagnosis present

## 2020-02-19 DIAGNOSIS — Z7982 Long term (current) use of aspirin: Secondary | ICD-10-CM | POA: Diagnosis not present

## 2020-02-19 DIAGNOSIS — Z96649 Presence of unspecified artificial hip joint: Secondary | ICD-10-CM | POA: Diagnosis not present

## 2020-02-19 DIAGNOSIS — R002 Palpitations: Secondary | ICD-10-CM

## 2020-02-19 DIAGNOSIS — Z87891 Personal history of nicotine dependence: Secondary | ICD-10-CM | POA: Diagnosis not present

## 2020-02-19 DIAGNOSIS — K449 Diaphragmatic hernia without obstruction or gangrene: Secondary | ICD-10-CM | POA: Diagnosis not present

## 2020-02-19 DIAGNOSIS — Z79899 Other long term (current) drug therapy: Secondary | ICD-10-CM | POA: Insufficient documentation

## 2020-02-19 DIAGNOSIS — I10 Essential (primary) hypertension: Secondary | ICD-10-CM | POA: Diagnosis not present

## 2020-02-19 LAB — URINALYSIS, COMPLETE (UACMP) WITH MICROSCOPIC
Bilirubin Urine: NEGATIVE
Glucose, UA: 50 mg/dL — AB
Hgb urine dipstick: NEGATIVE
Ketones, ur: 20 mg/dL — AB
Nitrite: NEGATIVE
Protein, ur: 100 mg/dL — AB
Specific Gravity, Urine: 1.021 (ref 1.005–1.030)
pH: 7 (ref 5.0–8.0)

## 2020-02-19 LAB — CBC
HCT: 44.7 % (ref 36.0–46.0)
Hemoglobin: 15 g/dL (ref 12.0–15.0)
MCH: 30.4 pg (ref 26.0–34.0)
MCHC: 33.6 g/dL (ref 30.0–36.0)
MCV: 90.5 fL (ref 80.0–100.0)
Platelets: 266 10*3/uL (ref 150–400)
RBC: 4.94 MIL/uL (ref 3.87–5.11)
RDW: 13.1 % (ref 11.5–15.5)
WBC: 10.4 10*3/uL (ref 4.0–10.5)
nRBC: 0 % (ref 0.0–0.2)

## 2020-02-19 LAB — COMPREHENSIVE METABOLIC PANEL
ALT: 13 U/L (ref 0–44)
AST: 20 U/L (ref 15–41)
Albumin: 4.3 g/dL (ref 3.5–5.0)
Alkaline Phosphatase: 84 U/L (ref 38–126)
Anion gap: 17 — ABNORMAL HIGH (ref 5–15)
BUN: 15 mg/dL (ref 8–23)
CO2: 23 mmol/L (ref 22–32)
Calcium: 9.9 mg/dL (ref 8.9–10.3)
Chloride: 104 mmol/L (ref 98–111)
Creatinine, Ser: 1.13 mg/dL — ABNORMAL HIGH (ref 0.44–1.00)
GFR calc Af Amer: 50 mL/min — ABNORMAL LOW (ref >60)
GFR calc non Af Amer: 43 mL/min — ABNORMAL LOW (ref >60)
Glucose, Bld: 184 mg/dL — ABNORMAL HIGH (ref 70–99)
Potassium: 3.1 mmol/L — ABNORMAL LOW (ref 3.5–5.1)
Sodium: 144 mmol/L (ref 135–145)
Total Bilirubin: 1.5 mg/dL — ABNORMAL HIGH (ref 0.3–1.2)
Total Protein: 8.1 g/dL (ref 6.5–8.1)

## 2020-02-19 LAB — LIPASE, BLOOD: Lipase: 22 U/L (ref 11–51)

## 2020-02-19 LAB — TROPONIN I (HIGH SENSITIVITY)
Troponin I (High Sensitivity): 55 ng/L — ABNORMAL HIGH (ref <18)
Troponin I (High Sensitivity): 48 ng/L — ABNORMAL HIGH (ref <18)

## 2020-02-19 MED ORDER — SODIUM CHLORIDE 0.9 % IV BOLUS
500.0000 mL | Freq: Once | INTRAVENOUS | Status: AC
  Administered 2020-02-19: 500 mL via INTRAVENOUS

## 2020-02-19 MED ORDER — IOHEXOL 300 MG/ML  SOLN
100.0000 mL | Freq: Once | INTRAMUSCULAR | Status: AC | PRN
  Administered 2020-02-19: 100 mL via INTRAVENOUS

## 2020-02-19 MED ORDER — ONDANSETRON HCL 4 MG/2ML IJ SOLN
INTRAMUSCULAR | Status: AC
  Administered 2020-02-19: 4 mg via INTRAVENOUS
  Filled 2020-02-19: qty 2

## 2020-02-19 MED ORDER — MORPHINE SULFATE (PF) 2 MG/ML IV SOLN
INTRAVENOUS | Status: AC
  Administered 2020-02-19: 2 mg via INTRAVENOUS
  Filled 2020-02-19: qty 1

## 2020-02-19 MED ORDER — METOCLOPRAMIDE HCL 5 MG/ML IJ SOLN
10.0000 mg | Freq: Once | INTRAMUSCULAR | Status: AC
  Administered 2020-02-19: 10 mg via INTRAVENOUS
  Filled 2020-02-19: qty 2

## 2020-02-19 MED ORDER — ONDANSETRON HCL 4 MG/2ML IJ SOLN: 4.0000 mg | Freq: Once | INTRAMUSCULAR | Status: AC

## 2020-02-19 MED ORDER — MORPHINE SULFATE (PF) 2 MG/ML IV SOLN: 2.0000 mg | Freq: Once | INTRAVENOUS | Status: AC

## 2020-02-19 MED ORDER — SODIUM CHLORIDE 0.9 % IV BOLUS
1000.0000 mL | Freq: Once | INTRAVENOUS | Status: AC
  Administered 2020-02-19: 1000 mL via INTRAVENOUS

## 2020-02-19 NOTE — ED Notes (Signed)
Spoke with md regarding pt's htn. No new orders received.  

## 2020-02-19 NOTE — ED Provider Notes (Signed)
[facility] Emergency Department Provider Note ____________________________________________   First MD Initiated Contact with Patient 02/19/20 1922     (approximate)  I have reviewed the triage vital signs and the nursing notes.   HISTORY  Chief Complaint Palpitations and Emesis  Level 5 caveat: History present illness limited due to dementia  HPI Kelly Richards is a 86 y.o. female with PMH as noted below who presents with nausea and vomiting, palpitations, and possible lower abdominal discomfort since yesterday. The patient's daughter gave most of the history. She states that the patient has chronic hip pain and has developed sciatica. She saw the orthopedist yesterday and was started on methocarbamol, prednisone, and tramadol. She states that the symptoms started a few hours after she took these medications. They have persisted since yesterday. The patient initially reported some lower abdominal pain, although denied it when I asked her.   Past Medical History:  Diagnosis Date  . Hyperlipidemia   . Hypertension     Patient Active Problem List   Diagnosis Date Noted  . Chest pain 06/05/2018    Past Surgical History:  Procedure Laterality Date  . TOTAL HIP ARTHROPLASTY      Prior to Admission medications   Medication Sig Start Date End Date Taking? Authorizing Provider  amLODipine (NORVASC) 5 MG tablet Take 5 mg by mouth daily. 02/06/20  Yes [provider]  aspirin EC 81 MG tablet Take 81 mg by mouth daily.   Yes [provider]  atorvastatin (LIPITOR) 20 MG tablet Take 20 mg by mouth daily.   Yes [provider]  methocarbamol (ROBAXIN) 500 MG tablet Take 500 mg by mouth 2 (two) times daily as needed. 02/18/20  Yes [provider]  predniSONE (STERAPRED UNI-PAK 48 TAB) 10 MG (48) TBPK tablet Take by mouth as directed. 02/18/20  Yes [provider]  traMADol (ULTRAM) 50 MG tablet Take 50 mg by mouth every 6  (six) hours as needed. 02/18/20  Yes [provider]    Allergies Lisinopril  Family History  Problem Relation Age of Onset  . Cancer Father     Social History Social History   Tobacco Use  . Smoking status: Former Games developer  . Smokeless tobacco: Never Used  Substance Use Topics  . Alcohol use: Yes  . Drug use: No    Review of Systems Level 5 caveat: Review of systems limited due to dementia Constitutional: No fever. Cardiovascular: Denies chest pain. Respiratory: Denies shortness of breath. Gastrointestinal: Positive for nausea and vomiting. Musculoskeletal: Positive for chronic back pain. Skin: Negative for rash.   ____________________________________________   PHYSICAL EXAM:  VITAL SIGNS: ED Triage Vitals  Enc Vitals Group     BP 02/19/20 1829 (!) 156/124     Pulse Rate 02/19/20 1829 (!) 145     Resp 02/19/20 1829 18     Temp 02/19/20 1831 98.5 F (36.9 C)     Temp Source 02/19/20 1831 Oral     SpO2 02/19/20 1829 96 %     Weight 02/19/20 1822 167 lb 8.8 oz (76 kg)     Height 02/19/20 1822 5\' 5"  (1.651 m)     Head Circumference --      Peak Flow --      Pain Score 02/19/20 1822 10     Pain Loc --      Pain Edu? --      Excl. in GC? --     Constitutional: Alert and oriented. Uncomfortable appearing  but in no acute distress. Eyes: Conjunctivae are normal. No scleral icterus. Head: Atraumatic. Nose: No congestion/rhinnorhea. Mouth/Throat: Mucous membranes are slightly dry.   Neck: Normal range of motion.  Cardiovascular: Tachycardic, irregular rhythm. Grossly normal heart sounds.  Good peripheral circulation. Respiratory: Normal respiratory effort.  No retractions. Lungs CTAB. Gastrointestinal: Soft and nontender. No rebound or guarding. No distention.  Genitourinary: No flank tenderness. Musculoskeletal: No lower extremity edema.  Extremities warm and well perfused.  Neurologic: Motor intact in all extremities. Skin:  Skin is warm and dry. No  rash noted. Psychiatric: Slightly anxious appearing but cooperative.  ____________________________________________   LABS (all labs ordered are listed, but only abnormal results are displayed)  Labs Reviewed  COMPREHENSIVE METABOLIC PANEL - Abnormal; Notable for the following components:      Result Value   Potassium 3.1 (*)    Glucose, Bld 184 (*)    Creatinine, Ser 1.13 (*)    Total Bilirubin 1.5 (*)    GFR calc non Af Amer 43 (*)    GFR calc Af Amer 50 (*)    Anion gap 17 (*)    All other components within normal limits  URINALYSIS, COMPLETE (UACMP) WITH MICROSCOPIC - Abnormal; Notable for the following components:   Color, Urine YELLOW (*)    APPearance HAZY (*)    Glucose, UA 50 (*)    Ketones, ur 20 (*)    Protein, ur 100 (*)    Leukocytes,Ua TRACE (*)    Bacteria, UA RARE (*)    All other components within normal limits  TROPONIN I (HIGH SENSITIVITY) - Abnormal; Notable for the following components:   Troponin I (High Sensitivity) 55 (*)    All other components within normal limits  TROPONIN I (HIGH SENSITIVITY) - Abnormal; Notable for the following components:   Troponin I (High Sensitivity) 48 (*)    All other components within normal limits  LIPASE, BLOOD  CBC  LACTIC ACID, PLASMA  LACTIC ACID, PLASMA   ____________________________________________  EKG  ED ECG REPORT I, Dionne Bucy, the attending physician, personally viewed and interpreted this ECG.  Date: 02/19/2020 EKG Time: 1822 Rate: 124 Rhythm: Sinus rhythm with possible sinus arrhythmia versus PVCs QRS Axis: normal Intervals: normal ST/T Wave abnormalities: Nonspecific ST abnormalities Narrative Interpretation: Nonspecific abnormalities, limited due to very poor quality EKG baseline   ED ECG REPORT I, Dionne Bucy, the attending physician, personally viewed and interpreted this ECG.  Date: 02/19/2020 EKG Time: 1942 Rate: 94 Rhythm: Sinus rhythm with PACs QRS Axis:  normal Intervals: normal ST/T Wave abnormalities: Nonspecific repolarization abnormality diffusely Narrative Interpretation: Nonspecific abnormalities with no evidence of acute ischemia   ____________________________________________  RADIOLOGY  CXR: No focal infiltrate or edema  ____________________________________________   PROCEDURES  Procedure(s) performed: No  Procedures  Critical Care performed: No ____________________________________________   INITIAL IMPRESSION / ASSESSMENT AND PLAN / ED COURSE  Pertinent labs & imaging results that were available during my care of the patient were reviewed by me and considered in my medical decision making (see chart for details).  86 year old female with PMH as noted above presents with nausea and vomiting, palpitations, generalized weakness since starting on prednisone, methocarbamol, and tramadol yesterday. She endorsed abdominal pain to the RN but not to me. History is limited due to dementia.  On exam, the patient is anxious and uncomfortable appearing but in no acute distress. She is hypertensive and slightly tachycardic with otherwise normal vital signs. On my exam, there is no focal abdominal tenderness or  any peritoneal signs.  EKG shows nonspecific ST abnormalities with no findings specific for acute ischemia.  Differential includes medication side effect, acute anxiety, dehydration or other metabolic etiology, gastroenteritis or foodborne illness, gastritis, hepatobiliary etiology, UTI, ACS, or less likely acute intra-abdominal process.  We will obtain chest x-ray, lab work-up, give fluids and antiemetics and reassess.  ----------------------------------------- 12:08 AM on 02/20/2020 -----------------------------------------  Chest x-ray showed no significant acute findings.  Lab work-up is significant for slightly elevated anion gap and borderline elevated troponin which did not increase on repeat after 2 hours.  The  patient continued to have persistent nausea although no significant actual vomitus coming up.  I proceeded with CT of the abdomen.  Radiology requested that I add on a CT chest due to a large hiatal hernia incompletely visualized on CT abdomen.  I was then contacted by the radiologist.  The patient has a very large hiatal hernia, evidence of possible gastric outlet obstruction, and concern for organoaxial rotation of the esophagus, with recommendation for further work-up via endoscopy.  I consulted Dr. Servando Snare from gastroenterology who reviewed the CT report and discussed the clinical findings with me.  He advises that if there is a true volvulus, he would not recommend endoscopy due to the risk of perforation or other complications, and recommended that I contact general surgery.  I then consulted Dr. Elenor Legato from general surgery who reviewed the images and report.  He advised that based on the imaging and clinical symptoms, the patient does not necessarily need acute intervention; however if she does, since the affected area is largely in the thoracic cavity, he would not recommend operating on the patient here without thoracic surgery as backup (which we do not have on-call on the weekend).   I discussed the results of the work-up with the patient and her daughter.  The patient appears somewhat more comfortable though is still nauseated.  She is hemodynamically stable.  I have added on a lactate to evaluate for possible ischemia or developing sepsis.  The patient's daughter would prefer transfer to [facility] if possible, so I will contact the [facility] transfer center.   ----------------------------------------- 12:20 AM on 02/20/2020 -----------------------------------------  I have discussed the case with the provider at the [facility] transfer center and we are awaiting callback.  I signed the patient out to the oncoming physician Dr. Roxan Hockey.  ____________________________________________   FINAL CLINICAL  IMPRESSION(S) / ED DIAGNOSES  Final diagnoses:  Hiatal hernia  Gastric outlet obstruction      NEW MEDICATIONS STARTED DURING THIS VISIT:  New Prescriptions   No medications on file     Note:  This document was prepared using Dragon voice recognition software and may include unintentional dictation errors.    Dionne Bucy, MD 02/20/20 [phone]

## 2020-02-19 NOTE — ED Notes (Signed)
Pt and daughter updated on plan of care. Pt appears more comfortable.

## 2020-02-19 NOTE — ED Triage Notes (Signed)
Pt comes POV from home with weakness, vomiting, heart palpitations starting yesterday. Pt AOx4. Pt unable to sleep due to the vomiting. Pt has vomited every hour today she states.

## 2020-02-19 NOTE — ED Notes (Signed)
Pt and daughter ringing call bell approx every 5-10 minutes for small requests, ringing again after this rn left room approx 6 min ago. Ed tech in to see to pt at this time.

## 2020-02-19 NOTE — ED Notes (Signed)
Patient transported to CT 

## 2020-02-19 NOTE — ED Notes (Signed)
Pt and daughter state that pt has high bp and has not taken meds today as she usually takes them at night.

## 2020-02-20 DIAGNOSIS — K449 Diaphragmatic hernia without obstruction or gangrene: Secondary | ICD-10-CM | POA: Diagnosis not present

## 2020-02-20 LAB — LACTIC ACID, PLASMA
Lactic Acid, Venous: 2.5 mmol/L (ref 0.5–1.9)
Lactic Acid, Venous: 2.7 mmol/L (ref 0.5–1.9)

## 2020-02-20 MED ORDER — SODIUM CHLORIDE 0.9 % IV SOLN: Freq: Once | INTRAVENOUS | Status: AC

## 2020-02-20 MED ORDER — MORPHINE SULFATE (PF) 2 MG/ML IV SOLN
2.0000 mg | Freq: Once | INTRAVENOUS | Status: AC
  Administered 2020-02-20: 2 mg via INTRAVENOUS
  Filled 2020-02-20: qty 1

## 2020-02-20 NOTE — ED Notes (Signed)
Critical lactic of 2.5 called from lab. Dr. Roxan Hockey notified, no new verbal orders received.

## 2020-02-20 NOTE — Progress Notes (Signed)
Case d/w Dr. Marisa Severin. 86 yo female hx Alzheimer's, HTN, CAD, carotid stenosis, presents w N/V. CTscan personally reviewed large HH,  No evidence of ischemic changes or perforation but there is concern for GOO.  Based on imaging findings she will likely required ngt decompression under fluoroscopic guidance, IVF and observation. If she were to deteriorate  She will need surgical intervention which in her case would be a very complex endeavor due to her age comorbidities and the massive thoracic component. THis might require thoracic surgery expertise and significant ICU care. Unfortunately we do not have thoracic surgery availability and this will likely be better manage at a high volume center/ tertiary facility.  Dr. Marisa Severin will d/w family about the situation.

## 2020-02-20 NOTE — ED Notes (Signed)
Pt with hr to 150, pain 10/10 md notified. Order for morphine received.

## 2020-02-20 NOTE — ED Notes (Signed)
Dawn and this rn each have tried to pass 72f ng tube, unable to at this time. Not able to get out of nasal cavity to posterior pharynx. md notified.

## 2020-02-20 NOTE — ED Notes (Signed)
Manual pressure of 180/100 left arm. Pt readjusted in bed, head of bed to high fowler's position to prevent aspiration if pt vomits, visitor leaving at this time. Call bell at right side. Pt denies needs to toilet.

## 2020-02-20 NOTE — ED Notes (Signed)
Sarah, rn attempted ng insertion without success.

## 2020-02-20 NOTE — ED Notes (Signed)
Attempt to call pt's daughter kim at phone number listed in chart as previously discussed with daughter, no answer, phone to voicemail.

## 2020-09-17 ENCOUNTER — Emergency Department
Admission: EM | Admit: 2020-09-17 | Discharge: 2020-09-17 | Disposition: A | Discharge status: Home / Self Care | Payer: Medicare HMO | Attending: Emergency Medicine | Admitting: Emergency Medicine

## 2020-09-17 ENCOUNTER — Emergency Department: Payer: Medicare HMO

## 2020-09-17 ENCOUNTER — Other Ambulatory Visit: Payer: Self-pay

## 2020-09-17 ENCOUNTER — Encounter: Payer: Self-pay | Admitting: Emergency Medicine

## 2020-09-17 DIAGNOSIS — W19XXXA Unspecified fall, initial encounter: Secondary | ICD-10-CM

## 2020-09-17 DIAGNOSIS — I10 Essential (primary) hypertension: Secondary | ICD-10-CM | POA: Insufficient documentation

## 2020-09-17 DIAGNOSIS — Z7982 Long term (current) use of aspirin: Secondary | ICD-10-CM | POA: Diagnosis not present

## 2020-09-17 DIAGNOSIS — S0990XA Unspecified injury of head, initial encounter: Secondary | ICD-10-CM | POA: Diagnosis not present

## 2020-09-17 DIAGNOSIS — Z79899 Other long term (current) drug therapy: Secondary | ICD-10-CM | POA: Diagnosis not present

## 2020-09-17 DIAGNOSIS — Y92000 Kitchen of unspecified non-institutional (private) residence as  the place of occurrence of the external cause: Secondary | ICD-10-CM | POA: Insufficient documentation

## 2020-09-17 DIAGNOSIS — W01198A Fall on same level from slipping, tripping and stumbling with subsequent striking against other object, initial encounter: Secondary | ICD-10-CM | POA: Insufficient documentation

## 2020-09-17 DIAGNOSIS — Z87891 Personal history of nicotine dependence: Secondary | ICD-10-CM | POA: Diagnosis not present

## 2020-09-17 DIAGNOSIS — M25551 Pain in right hip: Secondary | ICD-10-CM | POA: Insufficient documentation

## 2020-09-17 DIAGNOSIS — Z96643 Presence of artificial hip joint, bilateral: Secondary | ICD-10-CM | POA: Diagnosis not present

## 2020-09-17 DIAGNOSIS — R52 Pain, unspecified: Secondary | ICD-10-CM

## 2020-09-17 LAB — CBC WITH DIFFERENTIAL/PLATELET
Abs Immature Granulocytes: 0.02 10*3/uL (ref 0.00–0.07)
Basophils Absolute: 0 10*3/uL (ref 0.0–0.1)
Basophils Relative: 0 %
Eosinophils Absolute: 0.1 10*3/uL (ref 0.0–0.5)
Eosinophils Relative: 2 %
HCT: 36 % (ref 36.0–46.0)
Hemoglobin: 11.7 g/dL — ABNORMAL LOW (ref 12.0–15.0)
Immature Granulocytes: 0 %
Lymphocytes Relative: 19 %
Lymphs Abs: 1.5 10*3/uL (ref 0.7–4.0)
MCH: 29.8 pg (ref 26.0–34.0)
MCHC: 32.5 g/dL (ref 30.0–36.0)
MCV: 91.8 fL (ref 80.0–100.0)
Monocytes Absolute: 0.5 10*3/uL (ref 0.1–1.0)
Monocytes Relative: 6 %
Neutro Abs: 5.6 10*3/uL (ref 1.7–7.7)
Neutrophils Relative %: 73 %
Platelets: 213 10*3/uL (ref 150–400)
RBC: 3.92 MIL/uL (ref 3.87–5.11)
RDW: 13.7 % (ref 11.5–15.5)
WBC: 7.7 10*3/uL (ref 4.0–10.5)
nRBC: 0 % (ref 0.0–0.2)

## 2020-09-17 LAB — COMPREHENSIVE METABOLIC PANEL
ALT: 10 U/L (ref 0–44)
AST: 17 U/L (ref 15–41)
Albumin: 3.7 g/dL (ref 3.5–5.0)
Alkaline Phosphatase: 76 U/L (ref 38–126)
Anion gap: 9 (ref 5–15)
BUN: 18 mg/dL (ref 8–23)
CO2: 22 mmol/L (ref 22–32)
Calcium: 9.6 mg/dL (ref 8.9–10.3)
Chloride: 109 mmol/L (ref 98–111)
Creatinine, Ser: 0.92 mg/dL (ref 0.44–1.00)
GFR, Estimated: 60 mL/min — ABNORMAL LOW (ref >60)
Glucose, Bld: 106 mg/dL — ABNORMAL HIGH (ref 70–99)
Potassium: 3.7 mmol/L (ref 3.5–5.1)
Sodium: 140 mmol/L (ref 135–145)
Total Bilirubin: 0.9 mg/dL (ref 0.3–1.2)
Total Protein: 6.8 g/dL (ref 6.5–8.1)

## 2020-09-17 LAB — URINALYSIS, COMPLETE (UACMP) WITH MICROSCOPIC
Bilirubin Urine: NEGATIVE
Glucose, UA: NEGATIVE mg/dL
Hgb urine dipstick: NEGATIVE
Ketones, ur: NEGATIVE mg/dL
Nitrite: NEGATIVE
Protein, ur: NEGATIVE mg/dL
Specific Gravity, Urine: 1.021 (ref 1.005–1.030)
pH: 5 (ref 5.0–8.0)

## 2020-09-17 LAB — TROPONIN I (HIGH SENSITIVITY)
Troponin I (High Sensitivity): 35 ng/L — ABNORMAL HIGH (ref <18)
Troponin I (High Sensitivity): 36 ng/L — ABNORMAL HIGH (ref <18)

## 2020-09-17 LAB — PROTIME-INR
INR: 1 (ref 0.8–1.2)
Prothrombin Time: 13.1 seconds (ref 11.4–15.2)

## 2020-09-17 MED ORDER — ACETAMINOPHEN 325 MG PO TABS
650.0000 mg | ORAL_TABLET | Freq: Once | ORAL | Status: AC
  Administered 2020-09-17: 650 mg via ORAL
  Filled 2020-09-17: qty 2

## 2020-09-17 NOTE — ED Notes (Signed)
Pt in xray

## 2020-09-17 NOTE — ED Notes (Signed)
Pt given crackers, applesauce, and water

## 2020-09-17 NOTE — ED Notes (Signed)
Pt assisted to bathroom

## 2020-09-17 NOTE — ED Triage Notes (Signed)
Pt reports was cooking and turned and fell. Pt reports hit the back of her head. Pt states a little painful to head but not bad

## 2020-09-17 NOTE — ED Provider Notes (Signed)
[facility] Emergency Department Provider Note   ____________________________________________   Event Date/Time   First MD Initiated Contact with Patient 09/17/20 [phone]     (approximate)  I have reviewed the triage vital signs and the nursing notes.   HISTORY  Chief Complaint Fall    HPI [name] is a 87 y.o. female presents to the ED via EMS after a fall in the kitchen at approximately 7 AM.  Patient reports that she was cooking in the kitchen and turned and fell.  She states she did hit her head and has some swelling to her scalp.  She denies any present nausea or vomiting.  No LOC per family member.  Patient also complains of right hip pain.  Currently she is on aspirin 81 mg daily but no other blood thinners.  Patient has been ambulatory since her accident.  She rates her pain as a 2 out of 10.     Past Medical History:  Diagnosis Date  . Hyperlipidemia   . Hypertension     Patient Active Problem List   Diagnosis Date Noted  . Chest pain 06/05/2018    Past Surgical History:  Procedure Laterality Date  . TOTAL HIP ARTHROPLASTY      Prior to Admission medications   Medication Sig Start Date End Date Taking? Authorizing Provider  amLODipine (NORVASC) 5 MG tablet Take 5 mg by mouth daily. 02/06/20   [provider]  aspirin EC 81 MG tablet Take 81 mg by mouth daily.    [provider]  atorvastatin (LIPITOR) 20 MG tablet Take 20 mg by mouth daily.    [provider]  methocarbamol (ROBAXIN) 500 MG tablet Take 500 mg by mouth 2 (two) times daily as needed. 02/18/20   [provider]  predniSONE (STERAPRED UNI-PAK 48 TAB) 10 MG (48) TBPK tablet Take by mouth as directed. 02/18/20   [provider]  traMADol (ULTRAM) 50 MG tablet Take 50 mg by mouth every 6 (six) hours as needed. 02/18/20   [provider]    Allergies Lisinopril  Family History  Problem Relation Age of Onset  . Cancer  Father     Social History Social History   Tobacco Use  . Smoking status: Former Games developer  . Smokeless tobacco: Never Used  Substance Use Topics  . Alcohol use: Yes  . Drug use: No    Review of Systems Constitutional: No fever/chills Eyes: No visual changes. ENT: No trauma. Cardiovascular: Denies chest pain. Respiratory: Denies shortness of breath. Gastrointestinal: No abdominal pain.  No nausea, no vomiting.   Genitourinary: Negative for dysuria. Musculoskeletal: Positive for right hip pain. Skin: Positive hematoma posterior scalp. No open skin. Neurological: Negative for  focal weakness or numbness.  ____________________________________________   PHYSICAL EXAM:  VITAL SIGNS: ED Triage Vitals  Enc Vitals Group     BP 09/17/20 0829 128/89     Pulse Rate 09/17/20 0829 83     Resp 09/17/20 0829 19     Temp 09/17/20 0829 97.9 F (36.6 C)     Temp Source 09/17/20 0829 Oral     SpO2 09/17/20 0829 97 %     Weight 09/17/20 0830 167 lb 8.8 oz (76 kg)     Height 09/17/20 0830 5\' 5"  (1.651 m)     Head Circumference --      Peak Flow --      Pain Score 09/17/20 0830 2     Pain Loc --  Pain Edu? --      Excl. in GC? --     Constitutional: Alert and oriented. Well appearing and in no acute distress. Patient is talkative and cooperative with exam. Eyes: Conjunctivae are normal. PERRL. EOMI. Head: Soft tissue edema noted posteriorly with minimal tenderness on palpation. Skin is intact and no active bleeding is seen. Nose: No trauma. Neck: No stridor. No cervical tenderness on palpation posteriorly. No skin discoloration present. Cardiovascular: Normal rate, regular rhythm. Grossly normal heart sounds.  Good peripheral circulation. Respiratory: Normal respiratory effort.  No retractions. Lungs CTAB.  Nontender on palpation of the ribs bilaterally. Gastrointestinal: Soft and nontender. No distention. Bowel sounds normoactive x4 quadrants. Musculoskeletal: No tenderness is  noted on palpation of the thoracic or lumbar spine. Patient does have tenderness with range of motion and palpation of the right hip. No gross deformity is noted. Patient is status post right total hip replacement. Able move upper and lower extremities without any difficulty with the exception of the right leg which is mildly tender but no deformity. Patient has been ambulatory since her accident with little assistance.  Grip strength bilaterally is equal. Neurologic:  Normal speech and language.  Cranial nerves II through XII grossly intact.  No gross focal neurologic deficits are appreciated.  Skin:  Skin is warm, dry and intact. No abrasions or discoloration noted. Hematoma posterior scalp. Psychiatric: Mood and affect are normal. Speech and behavior are normal.  ____________________________________________   LABS (all labs ordered are listed, but only abnormal results are displayed)  Labs Reviewed  CBC WITH DIFFERENTIAL/PLATELET - Abnormal; Notable for the following components:      Result Value   Hemoglobin 11.7 (*)    All other components within normal limits  COMPREHENSIVE METABOLIC PANEL - Abnormal; Notable for the following components:   Glucose, Bld 106 (*)    GFR, Estimated 60 (*)    All other components within normal limits  TROPONIN I (HIGH SENSITIVITY) - Abnormal; Notable for the following components:   Troponin I (High Sensitivity) 36 (*)    All other components within normal limits  PROTIME-INR  URINALYSIS, COMPLETE (UACMP) WITH MICROSCOPIC  TROPONIN I (HIGH SENSITIVITY)   ____________________________________________  EKG Reviewed by doctors in major ED. Sinus rhythm with marked sinus arrhythmia ST and T wave abnormality.  Ventricular rate is 81. ____________________________________________  RADIOLOGY Beaulah Corin, personally viewed and evaluated these images (plain radiographs) as part of my medical decision making, as well as reviewing the written report by  the radiologist.   Official radiology report(s): DG Chest 2 View  Result Date: 09/17/2020 CLINICAL DATA:  Fall, EXAM: CHEST - 2 VIEW COMPARISON:  Chest radiograph 02/19/1999 21 FINDINGS: Normal cardiac silhouette. Large hiatal hernia posterior to the heart. No effusion, infiltrate or pneumothorax. No fracture. IMPRESSION: 1. No acute cardiopulmonary process. 2. Large hiatal hernia. Electronically Signed   By: Genevive Bi M.D.   On: 09/17/2020 12:29   CT Head Wo Contrast  Result Date: 09/17/2020 CLINICAL DATA:  Fall from standing with head and neck pain. EXAM: CT HEAD WITHOUT CONTRAST CT CERVICAL SPINE WITHOUT CONTRAST TECHNIQUE: Multidetector CT imaging of the head and cervical spine was performed following the standard protocol without intravenous contrast. Multiplanar CT image reconstructions of the cervical spine were also generated. COMPARISON:  None. FINDINGS: CT HEAD FINDINGS Brain: There is questionable small volume acute extra-axial blood products overlying the posterior right frontal lobe and right parietal lobe near the vertex to the right of the falx  cerebri measuring up to 3 mm in thickness (series 4, images 36-42). Alternatively this appearance may represent traversing blood vessels and dura. There is no associated mass effect or midline shift. No evidence of acute infarction or hydrocephalus. There is mild cerebral volume loss with associated ex vacuo dilatation. Periventricular white matter hypoattenuation likely represents chronic small vessel ischemic disease. Vascular: There are vascular calcifications in the carotid siphons. Skull: Normal. Negative for fracture or focal lesion. Sinuses/Orbits: No acute finding. Other: There is scalp hematoma/swelling overlying the vertex. CT CERVICAL SPINE FINDINGS Alignment: Normal. Skull base and vertebrae: No acute fracture. No primary bone lesion or focal pathologic process. Soft tissues and spinal canal: No prevertebral fluid or swelling. No  visible canal hematoma. Disc levels:  Multilevel degenerative disc and joint disease. Upper chest: Negative. Other: None. IMPRESSION: 1. Questionable small volume acute extra-axial blood products overlying the posterior right frontal lobe and right parietal lobe near the vertex to the right of the falx cerebri measuring up to 3 mm in thickness. Alternatively this appearance may represent traversing blood vessels and dura. There is no associated mass effect or midline shift. 2. No acute osseous injury of the cervical spine. These results were called by telephone at the time of interpretation on 09/17/2020 at 10:35 am to provider [facility] PA, who verbally acknowledged these results. Electronically Signed   By: Romona Curls M.D.   On: 09/17/2020 10:37   CT Cervical Spine Wo Contrast  Result Date: 09/17/2020 CLINICAL DATA:  Fall from standing with head and neck pain. EXAM: CT HEAD WITHOUT CONTRAST CT CERVICAL SPINE WITHOUT CONTRAST TECHNIQUE: Multidetector CT imaging of the head and cervical spine was performed following the standard protocol without intravenous contrast. Multiplanar CT image reconstructions of the cervical spine were also generated. COMPARISON:  None. FINDINGS: CT HEAD FINDINGS Brain: There is questionable small volume acute extra-axial blood products overlying the posterior right frontal lobe and right parietal lobe near the vertex to the right of the falx cerebri measuring up to 3 mm in thickness (series 4, images 36-42). Alternatively this appearance may represent traversing blood vessels and dura. There is no associated mass effect or midline shift. No evidence of acute infarction or hydrocephalus. There is mild cerebral volume loss with associated ex vacuo dilatation. Periventricular white matter hypoattenuation likely represents chronic small vessel ischemic disease. Vascular: There are vascular calcifications in the carotid siphons. Skull: Normal. Negative for fracture or focal lesion.  Sinuses/Orbits: No acute finding. Other: There is scalp hematoma/swelling overlying the vertex. CT CERVICAL SPINE FINDINGS Alignment: Normal. Skull base and vertebrae: No acute fracture. No primary bone lesion or focal pathologic process. Soft tissues and spinal canal: No prevertebral fluid or swelling. No visible canal hematoma. Disc levels:  Multilevel degenerative disc and joint disease. Upper chest: Negative. Other: None. IMPRESSION: 1. Questionable small volume acute extra-axial blood products overlying the posterior right frontal lobe and right parietal lobe near the vertex to the right of the falx cerebri measuring up to 3 mm in thickness. Alternatively this appearance may represent traversing blood vessels and dura. There is no associated mass effect or midline shift. 2. No acute osseous injury of the cervical spine. These results were called by telephone at the time of interpretation on 09/17/2020 at 10:35 am to provider [facility] PA, who verbally acknowledged these results. Electronically Signed   By: Romona Curls M.D.   On: 09/17/2020 10:37   DG HIP UNILAT WITH PELVIS 2-3 VIEWS RIGHT  Result Date: 09/17/2020 CLINICAL DATA:  Fell this morning, prior RIGHT hip surgery EXAM: DG HIP (WITH OR WITHOUT PELVIS) 2-3V RIGHT COMPARISON:  None FINDINGS: Osseous demineralization. Sclerosis and slight irregularity of the SI joints suggesting BILATERAL sacroiliitis. Mild narrowing of LEFT hip joint. RIGHT hip prosthesis seen. No acute fracture, dislocation, or bone destruction. Extensive atherosclerotic calcifications. Calcified phleboliths in pelvis within additional calcification which could represent a calcified uterine leiomyoma 2.6 cm diameter. IMPRESSION: RIGHT hip prosthesis and osseous demineralization. No acute bony abnormalities. Probable BILATERAL sacroiliitis. Electronically Signed   By: Ulyses SouthwardMark  Boles M.D.   On: 09/17/2020 10:32     ____________________________________________   PROCEDURES  Procedure(s) performed (including Critical Care):  Procedures   ____________________________________________   INITIAL IMPRESSION / ASSESSMENT AND PLAN / ED COURSE  As part of my medical decision making, I reviewed the following data within the electronic MEDICAL RECORD NUMBER Notes from prior ED visits and [place] Controlled Substance Database  87 year old female presents to the ED via EMS after a fall while standing in her kitchen cooking at 7 AM this morning.  Patient has some swelling to her scalp but denies any nausea, vomiting or visual changes.  There was no LOC per family member.  Patient does take aspirin 81 mg daily.  Patient initially complained of right hip pain but has been ambulatory with some assistance since her fall.  Physical exam is positive for some right hip tenderness to palpation.  She also has a hematoma to her scalp.  No active bleeding or foreign bodies was noted on skin.  Patient is cooperative and answers questions appropriately.  I spoke with Dr. Marcell BarlowYarborough who is on-call for neurosurgery about patient's CT scan.  There is a small questionable acute extra axial blood products overlying the posterior right frontal lobe and right parietal lobe measuring approximately 3 mm in thickness.  Radiology reports that this also could be transverse blood vessels.  After speaking with Dr. Myer HaffYarbrough it was decided that we repeat her CT scan in approximately 6 to 8 hours and if no worsening of her condition would send her home.  Right hip and pelvis x-rays were negative for any acute bony injury.    ----------------------------------------- 4:25 PM on 09/17/2020 ----------------------------------------- Patient's care was turned over to Greig RightSusan Fisher, PA C at this time and a CT head will be ordered for reevaluation at 5 PM.  Plans are to discharge this patient home if they are not any expanding areas or worsening of her  condition.  ____________________________________________   FINAL CLINICAL IMPRESSION(S) / ED DIAGNOSES  Final diagnoses:  Pain  Traumatic injury of head, initial encounter     ED Discharge Orders    None      *Please note:  Chai Cherre HugerMack was evaluated in Emergency Department on 09/17/2020 for the symptoms described in the history of present illness. She was evaluated in the context of the global COVID-19 pandemic, which necessitated consideration that the patient might be at risk for infection with the SARS-CoV-2 virus that causes COVID-19. Institutional protocols and algorithms that pertain to the evaluation of patients at risk for COVID-19 are in a state of rapid change based on information released by regulatory bodies including the CDC and federal and state organizations. These policies and algorithms were followed during the patient's care in the ED.  Some ED evaluations and interventions may be delayed as a result of limited staffing during and the pandemic.*   Note:  This document was prepared using Dragon voice recognition software and may include unintentional dictation  errors.    Tommi Rumps, PA-C 09/18/20 1135    Gilles Chiquito, MD 09/19/20 [phone]

## 2020-09-17 NOTE — ED Triage Notes (Signed)
Pt in via EMS from home with c/o fall. Pt was cokking in the kitchen, turn, twisted and fell hitting her head. No LOC. FSBS 159, BP 138/88, 99% RA. Pt takes 81mg  aspirin daily.

## 2020-09-17 NOTE — ED Provider Notes (Signed)
Assuming care from Bridget Hartshorn, PA-C.  Patient had a less than 3 mm area of concern on her CT.  Neurosurgery is requesting repeat and approximately 6 -8 hours.  Plan of care if CT is negative is to discharge home.  Physical Exam  BP (!) 146/73   Pulse 63   Temp 97.9 F (36.6 C) (Oral)   Resp (!) 22   Ht 5\' 5"  (1.651 m)   Wt 76 kg   SpO2 97%   BMI 27.88 kg/m   Physical Exam  ED Course/Procedures     Procedures  MDM   Patient appears well.  CT that was repeated at 1845 is normal.  The previous image was actually normal blood vessels per radiology.  I did explain everything to the patient.  She is more than ready to leave.  She will be discharged in stable condition.  Her daughter is coming to the hospital to take her home.       , PA-C 09/17/20 09/19/20, MD 09/17/20 2041

## 2021-09-01 ENCOUNTER — Other Ambulatory Visit: Payer: Self-pay

## 2021-09-01 ENCOUNTER — Observation Stay: Payer: Medicare HMO

## 2021-09-01 ENCOUNTER — Emergency Department: Payer: Medicare HMO

## 2021-09-01 ENCOUNTER — Encounter: Payer: Self-pay | Admitting: Emergency Medicine

## 2021-09-01 ENCOUNTER — Observation Stay
Admission: EM | Admit: 2021-09-01 | Discharge: 2021-09-02 | Disposition: A | Discharge status: Home Health | Payer: Medicare HMO | Attending: Family Medicine | Admitting: Family Medicine

## 2021-09-01 DIAGNOSIS — I739 Peripheral vascular disease, unspecified: Secondary | ICD-10-CM | POA: Diagnosis present

## 2021-09-01 DIAGNOSIS — F0280 Dementia in other diseases classified elsewhere without behavioral disturbance: Secondary | ICD-10-CM | POA: Insufficient documentation

## 2021-09-01 DIAGNOSIS — I10 Essential (primary) hypertension: Secondary | ICD-10-CM | POA: Diagnosis not present

## 2021-09-01 DIAGNOSIS — Z1389 Encounter for screening for other disorder: Secondary | ICD-10-CM

## 2021-09-01 DIAGNOSIS — Z87891 Personal history of nicotine dependence: Secondary | ICD-10-CM | POA: Diagnosis not present

## 2021-09-01 DIAGNOSIS — Z0189 Encounter for other specified special examinations: Secondary | ICD-10-CM

## 2021-09-01 DIAGNOSIS — Z79899 Other long term (current) drug therapy: Secondary | ICD-10-CM | POA: Insufficient documentation

## 2021-09-01 DIAGNOSIS — I2510 Atherosclerotic heart disease of native coronary artery without angina pectoris: Secondary | ICD-10-CM | POA: Diagnosis not present

## 2021-09-01 DIAGNOSIS — I639 Cerebral infarction, unspecified: Secondary | ICD-10-CM | POA: Diagnosis present

## 2021-09-01 DIAGNOSIS — R55 Syncope and collapse: Secondary | ICD-10-CM | POA: Diagnosis present

## 2021-09-01 DIAGNOSIS — G309 Alzheimer's disease, unspecified: Secondary | ICD-10-CM | POA: Insufficient documentation

## 2021-09-01 DIAGNOSIS — Z20822 Contact with and (suspected) exposure to covid-19: Secondary | ICD-10-CM | POA: Insufficient documentation

## 2021-09-01 DIAGNOSIS — E7800 Pure hypercholesterolemia, unspecified: Secondary | ICD-10-CM | POA: Diagnosis present

## 2021-09-01 DIAGNOSIS — Z7982 Long term (current) use of aspirin: Secondary | ICD-10-CM | POA: Insufficient documentation

## 2021-09-01 DIAGNOSIS — I471 Supraventricular tachycardia, unspecified: Secondary | ICD-10-CM

## 2021-09-01 DIAGNOSIS — Z8673 Personal history of transient ischemic attack (TIA), and cerebral infarction without residual deficits: Secondary | ICD-10-CM | POA: Insufficient documentation

## 2021-09-01 DIAGNOSIS — R2689 Other abnormalities of gait and mobility: Secondary | ICD-10-CM | POA: Insufficient documentation

## 2021-09-01 DIAGNOSIS — I63111 Cerebral infarction due to embolism of right vertebral artery: Secondary | ICD-10-CM | POA: Diagnosis present

## 2021-09-01 DIAGNOSIS — G459 Transient cerebral ischemic attack, unspecified: Secondary | ICD-10-CM

## 2021-09-01 DIAGNOSIS — F0390 Unspecified dementia without behavioral disturbance: Secondary | ICD-10-CM | POA: Diagnosis present

## 2021-09-01 DIAGNOSIS — R4781 Slurred speech: Secondary | ICD-10-CM

## 2021-09-01 LAB — RESP PANEL BY RT-PCR (FLU A&B, COVID) ARPGX2
Influenza A by PCR: NEGATIVE
Influenza B by PCR: NEGATIVE
SARS Coronavirus 2 by RT PCR: NEGATIVE

## 2021-09-01 LAB — BASIC METABOLIC PANEL
Anion gap: 7 (ref 5–15)
BUN: 16 mg/dL (ref 8–23)
CO2: 22 mmol/L (ref 22–32)
Calcium: 9.1 mg/dL (ref 8.9–10.3)
Chloride: 109 mmol/L (ref 98–111)
Creatinine, Ser: 0.77 mg/dL (ref 0.44–1.00)
GFR, Estimated: >60 mL/min (ref >60)
Glucose, Bld: 93 mg/dL (ref 70–99)
Potassium: 3.8 mmol/L (ref 3.5–5.1)
Sodium: 138 mmol/L (ref 135–145)

## 2021-09-01 LAB — CBC
HCT: 40 % (ref 36.0–46.0)
Hemoglobin: 12.9 g/dL (ref 12.0–15.0)
MCH: 30.6 pg (ref 26.0–34.0)
MCHC: 32.3 g/dL (ref 30.0–36.0)
MCV: 95 fL (ref 80.0–100.0)
Platelets: 240 10*3/uL (ref 150–400)
RBC: 4.21 MIL/uL (ref 3.87–5.11)
RDW: 13.4 % (ref 11.5–15.5)
WBC: 5 10*3/uL (ref 4.0–10.5)
nRBC: 0 % (ref 0.0–0.2)

## 2021-09-01 LAB — T4, FREE: Free T4: 0.84 ng/dL (ref 0.61–1.12)

## 2021-09-01 LAB — TSH: TSH: 0.625 u[IU]/mL (ref 0.350–4.500)

## 2021-09-01 LAB — D-DIMER, QUANTITATIVE: D-Dimer, Quant: 1.46 ug/mL-FEU — ABNORMAL HIGH (ref 0.00–0.50)

## 2021-09-01 LAB — MAGNESIUM: Magnesium: 2.2 mg/dL (ref 1.7–2.4)

## 2021-09-01 LAB — TROPONIN I (HIGH SENSITIVITY): Troponin I (High Sensitivity): 39 ng/L — ABNORMAL HIGH (ref <18)

## 2021-09-01 MED ORDER — ONDANSETRON HCL 4 MG PO TABS: 4.0000 mg | ORAL_TABLET | Freq: Four times a day (QID) | ORAL | Status: DC | PRN

## 2021-09-01 MED ORDER — SODIUM CHLORIDE 0.9% FLUSH
3.0000 mL | Freq: Two times a day (BID) | INTRAVENOUS | Status: DC
  Administered 2021-09-01: 3 mL via INTRAVENOUS

## 2021-09-01 MED ORDER — TRAZODONE HCL 50 MG PO TABS
50.0000 mg | ORAL_TABLET | Freq: Every day | ORAL | Status: DC
Start: 2021-09-01 — End: 2021-09-02
  Administered 2021-09-01: 50 mg via ORAL
  Filled 2021-09-01: qty 1

## 2021-09-01 MED ORDER — METOPROLOL TARTRATE 25 MG PO TABS
25.0000 mg | ORAL_TABLET | Freq: Two times a day (BID) | ORAL | Status: DC
  Administered 2021-09-01 – 2021-09-02 (×2): 25 mg via ORAL
  Filled 2021-09-01 (×2): qty 1

## 2021-09-01 MED ORDER — SODIUM CHLORIDE 0.9 % IV SOLN: 250.0000 mL | INTRAVENOUS | Status: DC | PRN

## 2021-09-01 MED ORDER — ONDANSETRON HCL 4 MG/2ML IJ SOLN: 4.0000 mg | Freq: Four times a day (QID) | INTRAMUSCULAR | Status: DC | PRN

## 2021-09-01 MED ORDER — IOHEXOL 350 MG/ML SOLN
75.0000 mL | Freq: Once | INTRAVENOUS | Status: AC | PRN
  Administered 2021-09-01: 75 mL via INTRAVENOUS

## 2021-09-01 MED ORDER — IBUPROFEN 400 MG PO TABS: 400.0000 mg | ORAL_TABLET | Freq: Four times a day (QID) | ORAL | Status: DC | PRN

## 2021-09-01 MED ORDER — ASPIRIN EC 81 MG PO TBEC
81.0000 mg | DELAYED_RELEASE_TABLET | Freq: Every day | ORAL | Status: DC
  Administered 2021-09-01 – 2021-09-02 (×2): 81 mg via ORAL
  Filled 2021-09-01 (×2): qty 1

## 2021-09-01 MED ORDER — SODIUM CHLORIDE 0.9% FLUSH: 3.0000 mL | INTRAVENOUS | Status: DC | PRN

## 2021-09-01 MED ORDER — ATORVASTATIN CALCIUM 20 MG PO TABS
20.0000 mg | ORAL_TABLET | Freq: Every day | ORAL | Status: DC
Start: 2021-09-01 — End: 2021-09-02
  Administered 2021-09-01 – 2021-09-02 (×2): 20 mg via ORAL
  Filled 2021-09-01 (×2): qty 1

## 2021-09-01 MED ORDER — ENOXAPARIN SODIUM 40 MG/0.4ML IJ SOSY
40.0000 mg | PREFILLED_SYRINGE | INTRAMUSCULAR | Status: DC
  Administered 2021-09-01: 40 mg via SUBCUTANEOUS
  Filled 2021-09-01: qty 0.4

## 2021-09-01 MED ORDER — AMLODIPINE BESYLATE 5 MG PO TABS
5.0000 mg | ORAL_TABLET | Freq: Every day | ORAL | Status: DC
  Administered 2021-09-01 – 2021-09-02 (×2): 5 mg via ORAL
  Filled 2021-09-01 (×2): qty 1

## 2021-09-01 NOTE — ED Triage Notes (Signed)
First Nurse Note:  ARrives via [facility] for c/o syncope.  From home.  Hx dementia and HTN.  NSR per report.  Cardiac monitoring showed PAC's.  VS wnl.  20 g L forearm.  300 cc NS. CBG:  124

## 2021-09-01 NOTE — ED Notes (Signed)
Pt oob to BR with 1 assist. Pt unsteady, needs assist. Pt missed urine collection "hat". Will try again next time.

## 2021-09-01 NOTE — ED Notes (Signed)
Pt given sandwich box and drink.

## 2021-09-01 NOTE — ED Notes (Signed)
Pt resting with eyes closed, respirations even and unlabored.

## 2021-09-01 NOTE — ED Notes (Signed)
Pt alert at this time, no needs or requests expressed.  Encouraged pt to alert nursing staff if needed.

## 2021-09-01 NOTE — ED Provider Notes (Addendum)
[facility] Provider Note    Event Date/Time   First MD Initiated Contact with Patient 09/01/21 1458     (approximate)   History   Loss of Consciousness   HPI  Kelly Richards is a 88 y.o. female with hypertension, hyperlipidemia, Alzheimer's disease who comes in with concerns for near syncope.  Patient reports that she felt dizzy like she was going to pass out but never actually passed out.  She states she walked down the stairs to tell her daughter when the daughter's husband found her.  When daughter got there she had stated that she had nearly passed out.  She had slurred speech therefore they called EMS.  Slurred speech had resolved within an hour and now she is at her baseline self.  However she still reports having episodes of dizziness like she is going to pass out.  Patient denies falling and hitting her head.  Denies any headaches.  She does report some shortness of breath that is new for her.  Denies any abdominal pain.  Denies any urinary symptoms.  I did review a hospital admission note from 7/4 over at [facility] where she was having issues with nausea and vomiting secondary to a large hiatal hernia.  It looks like at that time she had some paroxysmal SVT that they treated with metoprolol  Patient currently resides at home with her family and is supposed to use a walker but has not been using them.     Physical Exam   Triage Vital Signs: ED Triage Vitals  Enc Vitals Group     BP 09/01/21 1001 (!) 148/110     Pulse Rate 09/01/21 1001 82     Resp 09/01/21 1001 16     Temp 09/01/21 1001 97.9 F (36.6 C)     Temp Source 09/01/21 1001 Oral     SpO2 09/01/21 1001 95 %     Weight 09/01/21 0935 167 lb 8.8 oz (76 kg)     Height 09/01/21 0935 5\' 5"  (1.651 m)     Head Circumference --      Peak Flow --      Pain Score --      Pain Loc --      Pain Edu? --      Excl. in [place]? --     Most recent vital signs: Vitals:   09/01/21 1001 09/01/21  1408  BP: (!) 148/110 (!) 140/98  Pulse: 82 80  Resp: 16 15  Temp: 97.9 F (36.6 C)   SpO2: 95% 96%     General: Awake, no distress.  Elderly female in no distress, alert and oriented x2.  Unsure of the year but does know that the recent holiday was Christmas. Head: No evidence of trauma. CV:  Good peripheral perfusion.  Occasional tachycardia Resp:  Normal effort.  Clear lungs Abd:  No distention.  Soft and nontender Neurologic:  Cranial nerves intact.  Equal strength in arms and legs.   ED Results / Procedures / Treatments   Labs (all labs ordered are listed, but only abnormal results are displayed) Labs Reviewed  D-DIMER, QUANTITATIVE (NOT AT [facility]) - Abnormal; Notable for the following components:      Result Value   D-Dimer, Quant 1.46 (*)    All other components within normal limits  TROPONIN I (HIGH SENSITIVITY) - Abnormal; Notable for the following components:   Troponin I (High Sensitivity) 39 (*)    All other components within normal limits  RESP PANEL BY RT-PCR (FLU A&B, COVID) [id number]  BASIC METABOLIC PANEL  CBC  TSH  T4, FREE  MAGNESIUM  URINALYSIS, ROUTINE W REFLEX MICROSCOPIC  CBG MONITORING, ED     EKG  My interpretation of EKG: EKG was normal sinus rate of 62 without any ST elevation but does have some T wave inversions in 2 aVL V4 through V6 with otherwise normal intervals  Repeat EKG is a an atrial tachycardia with a rate of 127 with some continued T wave inversions as mentioned before and prolonged PR interval   RADIOLOGY I have reviewed the xray personally and agree with radiology read no PNA, large hiatal hernia  CT head no ICH CT PE no PE    PROCEDURES:  Critical Care performed: No  .1-3 Lead EKG Interpretation Performed by: Vanessa [place], MD Authorized by: Vanessa Dyer, MD     Interpretation: abnormal     ECG rate:  60-130s   ECG rate assessment: normal     Rhythm: sinus rhythm     Ectopy: PAC     Conduction: normal    Comments:     Patient has occasional PACs and then will go into a sinus tachycardia with rates in the 120s to 130s and that after a few seconds break back out to a sinus rhythm with PACs   MEDICATIONS ORDERED IN ED: Medications  metoprolol tartrate (LOPRESSOR) tablet 25 mg (25 mg Oral Given 09/01/21 1733)  iohexol (OMNIPAQUE) 350 MG/ML injection 75 mL (75 mLs Intravenous Contrast Given 09/01/21 1647)     IMPRESSION / MDM / [place] / ED COURSE  I reviewed the triage vital signs and the nursing notes.  Patient with history of hypertension, hyperlipidemia, SVT who comes in with concern for syncope episode in the setting of probable atrial tachycardia. Differential diagnosis includes, but is not limited to, thyroid dysfunction, electrolyte abnormalities, ACS, PE, COVID, flu.    We will proceed with D-dimer if positive will get PE study.  If negative will proceed with CT head given the slurred speech possible TIA.  No residual stroke symptoms at this time.  NIH stroke scale is 0 therefore stroke code was not called  The patient is on the cardiac monitor to evaluate for evidence of arrhythmia and/or significant heart rate changes.  On review labs no evidence of thyroid dysfunction, troponins are slightly elevated but stable similar to priors unlikely ACS.  No significant electrolyte abnormalities..  Given the episodes of atrial tachycardia but than heart rates going back down into the 60s we will consult cardiology for further recommendations.  Appears the patient is on metoprolol 25  XL mg daily denies any missed doses  Discussed with Dr. Garen Lah from cardiology to recommend starting on metoprolol succinate 25 mg twice daily and admitting to the hospital for cardiac monitoring to see if the medications need to be adjusted further.  Plan to get echocardiogram as well.  CT imaging was negative.  We will discussed with the hospital team for admission     FINAL CLINICAL  IMPRESSION(S) / ED DIAGNOSES   Final diagnoses:  Near syncope  SVT (supraventricular tachycardia) ([place])     Rx / DC Orders   ED Discharge Orders     None        Note:  This document was prepared using Dragon voice recognition software and may include unintentional dictation errors.   Vanessa [place], MD 09/01/21 1744    Vanessa , MD 09/01/21  1745 ° °

## 2021-09-01 NOTE — H&P (Addendum)
History and Physical    Kelly Richards QVZ:[id number] DOB: [birth year] DOA: 09/01/2021  PCP: Orene Desanctis, MD    Patient coming from: Home    Chief Complaint: Near syncope, arrhythmia  HPI: Kelly Richards is a 88 y.o. female with medical history significant of hypertension, coronary artery disease, hyperlipidemia, CVA, Alzheimer's disease, back pain came with a chief complaint of questionable near syncope.  Patient reports she felt dizzy, lightheaded to almost pass out.  She states she walked down the stairs and tell her daughter she almost passed out.  She had slurred speech for about 1 hour therefore they called EMS.  Slurred speech had resolved and she is back to the baseline now.  However she still reports having episodes of dizziness like she is going to pass out.  She has episodes of atrial tachycardia questionable SVT per emergency room physician.  She has a history of atrial tachycardia at [facility] when she had surgery for her hiatal hernia and was started on metoprolol.  Denies any fever chills nausea vomiting diarrhea.  Denies any focal neurologic deficit.  ED Course:  In the emergency room she did not have any complaints. She had episodes of actual tachycardia with a rate 127 with some T inversion in lateral leads secondary to hypertension She has mild elevated troponin which is mildly elevated all the time. D-dimer 1.4 CTA chest negative for PE or acute pathology CT of the head no acute pathology Per emergency room physician patient has a rate of 58-60 and then goes to sinus tachycardia, atrial tachycardia? and then after few seconds back to sinus rhythm with PAC She discussed the case with the cardiology Dr. Adolm Joseph who recommended echo in the morning and started on metoprolol 25 twice daily.  Review of Systems: As per HPI otherwise 10 point review of systems negative.    Past Medical History:  Diagnosis Date   Hyperlipidemia    Hypertension     Past Surgical History:  Procedure  Laterality Date   TOTAL HIP ARTHROPLASTY       reports that she has quit smoking. She has never used smokeless tobacco. She reports current alcohol use. She reports that she does not use drugs.  Allergies  Allergen Reactions   Lisinopril Cough    Family History  Problem Relation Age of Onset   Cancer Father      Prior to Admission medications   Medication Sig Start Date End Date Taking? Authorizing Provider  amLODipine (NORVASC) 5 MG tablet Take 5 mg by mouth daily. 02/06/20  Yes [provider]  aspirin EC 81 MG tablet Take 81 mg by mouth daily.   Yes [provider]  atorvastatin (LIPITOR) 20 MG tablet Take 20 mg by mouth daily.   Yes [provider]  metoprolol succinate (TOPROL-XL) 25 MG 24 hr tablet Take 25 mg by mouth daily. 05/05/21  Yes [provider]  traZODone (DESYREL) 50 MG tablet Take 50 mg by mouth at bedtime. 08/27/21  Yes [provider]  methocarbamol (ROBAXIN) 500 MG tablet Take 500 mg by mouth 2 (two) times daily as needed. Patient not taking: Reported on 09/01/2021 02/18/20   [provider]  predniSONE (STERAPRED UNI-PAK 48 TAB) 10 MG (48) TBPK tablet Take by mouth as directed. Patient not taking: Reported on 09/01/2021 02/18/20   [provider]  traMADol (ULTRAM) 50 MG tablet Take 50 mg by mouth every 6 (six) hours as needed. Patient not taking: Reported on 09/01/2021 02/18/20   [provider]    Physical Exam: Vitals:   09/01/21 1515 09/01/21 1530 09/01/21 1600 09/01/21 1832  BP: (!) 180/97 (!) 174/138 (!) 177/164 (!) 190/102  Pulse:    80  Resp: 15 19 19  (!) 23  Temp:      TempSrc:      SpO2: 97% 99% 100% 97%  Weight:      Height:        Constitutional: NAD, calm, comfortable Vitals:   09/01/21 1515 09/01/21 1530 09/01/21 1600 09/01/21 1832  BP: (!) 180/97 (!) 174/138 (!) 177/164 (!) 190/102  Pulse:    80  Resp: 15 19 19  (!) 23  Temp:      TempSrc:      SpO2: 97% 99% 100% 97%   Weight:      Height:       Eyes: PERRL, lids and conjunctivae normal ENMT: Mucous membranes are moist. Posterior pharynx clear of any exudate or lesions.Normal dentition.  Neck: normal, supple, no masses, no thyromegaly Respiratory: clear to auscultation bilaterally, no wheezing, no crackles. Normal respiratory effort. No accessory muscle use.  Cardiovascular: Regular rate and rhythm, no murmurs / rubs / gallops. No extremity edema. 2+ pedal pulses. No carotid bruits.  Abdomen: no tenderness, no masses palpated. No hepatosplenomegaly. Bowel sounds positive.  Musculoskeletal: no clubbing / cyanosis. No joint deformity upper and lower extremities. Good ROM, no contractures. Normal muscle tone.  Skin: no rashes, lesions, ulcers. No induration Neurologic: CN 2-12 grossly intact. Sensation intact, DTR normal. Strength 5/5 in all 4.  Psychiatric: Normal judgment and insight. Alert and oriented x 3. Normal mood.    Labs on Admission: I have personally reviewed following labs and imaging studies  CBC: Recent Labs  Lab 09/01/21 1005  WBC 5.0  HGB 12.9  HCT 40.0  MCV 95.0  PLT 240   Basic Metabolic Panel: Recent Labs  Lab 09/01/21 1005 09/01/21 1013  NA 138  --   K 3.8  --   CL 109  --   CO2 22  --   GLUCOSE 93  --   BUN 16  --   CREATININE 0.77  --   CALCIUM 9.1  --   MG  --  2.2   GFR: Estimated Creatinine Clearance: 48.6 mL/min (by C-G formula based on SCr of 0.77 mg/dL). Liver Function Tests: No results for input(s): AST, ALT, ALKPHOS, BILITOT, PROT, ALBUMIN in the last 168 hours. No results for input(s): LIPASE, AMYLASE in the last 168 hours. No results for input(s): AMMONIA in the last 168 hours. Coagulation Profile: No results for input(s): INR, PROTIME in the last 168 hours. Cardiac Enzymes: No results for input(s): CKTOTAL, CKMB, CKMBINDEX, TROPONINI in the last 168 hours. BNP (last 3 results) No results for input(s): PROBNP in the last 8760 hours. HbA1C: No  results for input(s): HGBA1C in the last 72 hours. CBG: No results for input(s): GLUCAP in the last 168 hours. Lipid Profile: No results for input(s): CHOL, HDL, LDLCALC, TRIG, CHOLHDL, LDLDIRECT in the last 72 hours. Thyroid Function Tests: Recent Labs    09/01/21 1013  TSH 0.625  FREET4 0.84   Anemia Panel: No results for input(s): VITAMINB12, FOLATE, FERRITIN, TIBC, IRON, RETICCTPCT in the last 72 hours. Urine analysis:    Component Value Date/Time   COLORURINE YELLOW (A) 09/17/2020 1854   APPEARANCEUR CLOUDY (A) 09/17/2020 1854   LABSPEC 1.021 09/17/2020 1854   PHURINE 5.0 09/17/2020 1854   GLUCOSEU NEGATIVE 09/17/2020 1854   HGBUR NEGATIVE 09/17/2020 1854  BILIRUBINUR NEGATIVE 09/17/2020 1854   KETONESUR NEGATIVE 09/17/2020 1854   PROTEINUR NEGATIVE 09/17/2020 1854   NITRITE NEGATIVE 09/17/2020 1854   LEUKOCYTESUR SMALL (A) 09/17/2020 1854    Radiological Exams on Admission: CT HEAD WO CONTRAST (5MM)  Result Date: 09/01/2021 CLINICAL DATA:  Syncope.  History of dementia. EXAM: CT HEAD WITHOUT CONTRAST TECHNIQUE: Contiguous axial images were obtained from the base of the skull through the vertex without intravenous contrast. RADIATION DOSE REDUCTION: This exam was performed according to the departmental dose-optimization program which includes automated exposure control, adjustment of the mA and/or kV according to patient size and/or use of iterative reconstruction technique. COMPARISON:  Head CT, 09/17/2020. FINDINGS: Brain: No evidence of acute infarction, hemorrhage, hydrocephalus, extra-axial collection or mass lesion/mass effect. Ventricular and sulcal enlargement reflecting volume loss, without change from the prior study. Old left thalamic and right basal ganglia lacunar infarcts. Patchy areas of white matter hypoattenuation are noted consistent with moderate chronic microvascular ischemic change. These findings are stable. Vascular: No hyperdense vessel or unexpected  calcification. Skull: Normal. Negative for fracture or focal lesion. Sinuses/Orbits: Visualized globes and orbits are unremarkable. Visualized sinuses are clear. Other: None. IMPRESSION: 1. No acute intracranial abnormalities. 2. Age related volume loss, old lacunar infarcts and moderate chronic microvascular ischemic change. Electronically Signed   By: Amie Portlandavid  Ormond M.D.   On: 09/01/2021 17:28   CT Angio Chest PE W and/or Wo Contrast  Result Date: 09/01/2021 CLINICAL DATA:  Syncope. History of dementia. PACs on cardiac monitoring. EXAM: CT ANGIOGRAPHY CHEST WITH CONTRAST TECHNIQUE: Multidetector CT imaging of the chest was performed using the standard protocol during bolus administration of intravenous contrast. Multiplanar CT image reconstructions and MIPs were obtained to evaluate the vascular anatomy. RADIATION DOSE REDUCTION: This exam was performed according to the departmental dose-optimization program which includes automated exposure control, adjustment of the mA and/or kV according to patient size and/or use of iterative reconstruction technique. CONTRAST:  75mL OMNIPAQUE IOHEXOL 350 MG/ML SOLN COMPARISON:  02/19/2020. FINDINGS: Cardiovascular: Pulmonary arteries are well opacified. There is no evidence of a pulmonary embolism. Heart is mildly enlarged. Trace pericardial effusion. Left and right coronary artery calcifications. Great vessels are normal in caliber. Aorta is not opacified. There are aortic atherosclerotic calcifications. Mediastinum/Nodes: No neck base, mediastinal or hilar masses or pathologically enlarged lymph nodes. Trachea and esophagus are unremarkable. Lungs/Pleura: Minor subsegmental atelectasis at the lung bases. 3 mm nodule, right upper lobe, subpleural, image 26, series 6, stable from the prior CT, benign. No other nodules. Lungs otherwise clear. Stable eventration of the left hemidiaphragm. No pleural effusion or pneumothorax. Upper Abdomen: No acute abnormality.  Musculoskeletal: No fracture or acute finding. No bone lesion. No chest wall masses. Review of the MIP images confirms the above findings. IMPRESSION: 1. No evidence of a pulmonary embolism. 2. No acute findings.  No evidence of pneumonia or pulmonary edema. 3. Aortic arthrosclerosis. Two vessel coronary artery calcification, mild cardiomegaly and trace pericardial effusion. Aortic Atherosclerosis (ICD10-I70.0). Electronically Signed   By: Amie Portlandavid  Ormond M.D.   On: 09/01/2021 17:35   DG Chest Portable 1 View  Result Date: 09/01/2021 CLINICAL DATA:  Shortness of breath. EXAM: PORTABLE CHEST 1 VIEW COMPARISON:  September 17, 2020 FINDINGS: Large hiatal hernia.  Chronic elevation of left hemidiaphragm. Cardiomediastinal silhouette is normal. Mediastinal contours appear intact. There is no evidence of focal airspace consolidation, pleural effusion or pneumothorax. Osseous structures are without acute abnormality. Soft tissues are grossly normal. IMPRESSION: Large hiatal hernia. No evidence of active  cardiopulmonary disease. Electronically Signed   By: Ted Mcalpineobrinka  Dimitrova M.D.   On: 09/01/2021 16:29    EKG: Independently reviewed.  Sinus tachycardia no acute ST-T changes, T inversion secondary to LVH  Assessment/Plan Principal Problem:   Near syncope Active Problems:   SVT (supraventricular tachycardia) (HCC)   Essential hypertension   CAD (coronary artery disease)   CVA (cerebral vascular accident) (HCC)   Hypercholesterolemia   Near syncope Secondary to arrhythmia, atrial tachycardia Rule out TIA versus CVA EKG normal sinus no acute ST-T changes Troponin mildly elevated which are chronic mildly elevated Plan cardiology consult, echocardiogram in the morning Slurry speech for 1 hour which resolved CT of the head negative Plan MRI, carotid ultrasound in the morning in the morning Resume aspirin, statin started on Metroprolol per cardiology  Essential hypertension Resume  Norvasc  Hypercholesterolemia Resume statin  Elevated D-dimer CTA negative for PE   DVT prophylaxis: Lovenox Code Status: Full code Family Communication: No family in the room Disposition Plan: Discharge home Consults called: Cardiology by ER Admission status: Observation   Reneisha Stilley G Cerra Eisenhower MD [facility]  If 7PM-7AM, please contact night-coverage [email or web address]   09/01/2021, 7:33 PM

## 2021-09-02 ENCOUNTER — Encounter: Payer: Self-pay | Admitting: Emergency Medicine

## 2021-09-02 ENCOUNTER — Observation Stay: Payer: Medicare HMO

## 2021-09-02 ENCOUNTER — Observation Stay (HOSPITAL_BASED_OUTPATIENT_CLINIC_OR_DEPARTMENT_OTHER)
Admit: 2021-09-02 | Discharge: 2021-09-02 | Disposition: A | Discharge status: Home / Self Care | Payer: Medicare HMO | Attending: Emergency Medicine | Admitting: Emergency Medicine

## 2021-09-02 DIAGNOSIS — F0390 Unspecified dementia without behavioral disturbance: Secondary | ICD-10-CM

## 2021-09-02 DIAGNOSIS — I350 Nonrheumatic aortic (valve) stenosis: Secondary | ICD-10-CM

## 2021-09-02 DIAGNOSIS — I739 Peripheral vascular disease, unspecified: Secondary | ICD-10-CM | POA: Diagnosis present

## 2021-09-02 DIAGNOSIS — I471 Supraventricular tachycardia: Secondary | ICD-10-CM

## 2021-09-02 DIAGNOSIS — E7800 Pure hypercholesterolemia, unspecified: Secondary | ICD-10-CM | POA: Diagnosis not present

## 2021-09-02 DIAGNOSIS — I2583 Coronary atherosclerosis due to lipid rich plaque: Secondary | ICD-10-CM

## 2021-09-02 DIAGNOSIS — I63111 Cerebral infarction due to embolism of right vertebral artery: Secondary | ICD-10-CM

## 2021-09-02 DIAGNOSIS — I672 Cerebral atherosclerosis: Secondary | ICD-10-CM

## 2021-09-02 DIAGNOSIS — I10 Essential (primary) hypertension: Secondary | ICD-10-CM | POA: Diagnosis not present

## 2021-09-02 DIAGNOSIS — I517 Cardiomegaly: Secondary | ICD-10-CM | POA: Diagnosis not present

## 2021-09-02 DIAGNOSIS — I6389 Other cerebral infarction: Secondary | ICD-10-CM

## 2021-09-02 DIAGNOSIS — I6523 Occlusion and stenosis of bilateral carotid arteries: Secondary | ICD-10-CM

## 2021-09-02 DIAGNOSIS — I6521 Occlusion and stenosis of right carotid artery: Secondary | ICD-10-CM

## 2021-09-02 DIAGNOSIS — I2510 Atherosclerotic heart disease of native coronary artery without angina pectoris: Secondary | ICD-10-CM

## 2021-09-02 LAB — COMPREHENSIVE METABOLIC PANEL
ALT: 9 U/L (ref 0–44)
AST: 13 U/L — ABNORMAL LOW (ref 15–41)
Albumin: 3.7 g/dL (ref 3.5–5.0)
Alkaline Phosphatase: 62 U/L (ref 38–126)
Anion gap: 9 (ref 5–15)
BUN: 12 mg/dL (ref 8–23)
CO2: 20 mmol/L — ABNORMAL LOW (ref 22–32)
Calcium: 9.1 mg/dL (ref 8.9–10.3)
Chloride: 108 mmol/L (ref 98–111)
Creatinine, Ser: 0.73 mg/dL (ref 0.44–1.00)
GFR, Estimated: >60 mL/min (ref >60)
Glucose, Bld: 103 mg/dL — ABNORMAL HIGH (ref 70–99)
Potassium: 3.5 mmol/L (ref 3.5–5.1)
Sodium: 137 mmol/L (ref 135–145)
Total Bilirubin: 0.9 mg/dL (ref 0.3–1.2)
Total Protein: 6.6 g/dL (ref 6.5–8.1)

## 2021-09-02 LAB — ECHOCARDIOGRAM COMPLETE
AR max vel: 0.66 cm2
AV Area VTI: 0.72 cm2
AV Area mean vel: 0.73 cm2
AV Mean grad: 14 mmHg
AV Peak grad: 25 mmHg
Ao pk vel: 2.5 m/s
Area-P 1/2: 2.18 cm2
Calc EF: 72.4 %
Height: 65 in
MV VTI: 1.34 cm2
S' Lateral: 2.5 cm
Single Plane A2C EF: 73.9 %
Single Plane A4C EF: 68.7 %
Weight: 2680.79 oz

## 2021-09-02 LAB — LIPID PANEL
Cholesterol: 223 mg/dL — ABNORMAL HIGH (ref 0–200)
HDL: 80 mg/dL (ref >40)
LDL Cholesterol: 129 mg/dL — ABNORMAL HIGH (ref 0–99)
Total CHOL/HDL Ratio: 2.8 RATIO
Triglycerides: 69 mg/dL (ref <150)
VLDL: 14 mg/dL (ref 0–40)

## 2021-09-02 LAB — URINALYSIS, ROUTINE W REFLEX MICROSCOPIC
Bilirubin Urine: NEGATIVE
Glucose, UA: NEGATIVE mg/dL
Hgb urine dipstick: NEGATIVE
Ketones, ur: NEGATIVE mg/dL
Leukocytes,Ua: NEGATIVE
Nitrite: NEGATIVE
Protein, ur: NEGATIVE mg/dL
Specific Gravity, Urine: 1.015 (ref 1.005–1.030)
pH: 6.5 (ref 5.0–8.0)

## 2021-09-02 LAB — CBC
HCT: 40.8 % (ref 36.0–46.0)
Hemoglobin: 13.4 g/dL (ref 12.0–15.0)
MCH: 30.2 pg (ref 26.0–34.0)
MCHC: 32.8 g/dL (ref 30.0–36.0)
MCV: 92.1 fL (ref 80.0–100.0)
Platelets: 241 10*3/uL (ref 150–400)
RBC: 4.43 MIL/uL (ref 3.87–5.11)
RDW: 13.2 % (ref 11.5–15.5)
WBC: 5.3 10*3/uL (ref 4.0–10.5)
nRBC: 0 % (ref 0.0–0.2)

## 2021-09-02 MED ORDER — STROKE: EARLY STAGES OF RECOVERY BOOK
Freq: Once | Status: DC
Start: 2021-09-02 — End: 2021-09-02

## 2021-09-02 MED ORDER — ATORVASTATIN CALCIUM 40 MG PO TABS: 40.0000 mg | ORAL_TABLET | Freq: Every day | ORAL | 3 refills | Status: AC

## 2021-09-02 MED ORDER — IOHEXOL 350 MG/ML SOLN
75.0000 mL | Freq: Once | INTRAVENOUS | Status: AC | PRN
  Administered 2021-09-02: 75 mL via INTRAVENOUS

## 2021-09-02 MED ORDER — VERAPAMIL HCL ER 120 MG PO TBCR: 120.0000 mg | EXTENDED_RELEASE_TABLET | Freq: Every day | ORAL | 2 refills | Status: AC

## 2021-09-02 MED ORDER — CLOPIDOGREL BISULFATE 75 MG PO TABS
75.0000 mg | ORAL_TABLET | Freq: Every day | ORAL | 2 refills | Status: AC
Start: 2021-09-02 — End: 2021-12-01

## 2021-09-02 MED ORDER — ASPIRIN EC 325 MG PO TBEC: 325.0000 mg | DELAYED_RELEASE_TABLET | Freq: Every day | ORAL | 6 refills | Status: AC

## 2021-09-02 MED ORDER — VERAPAMIL HCL 40 MG PO TABS
40.0000 mg | ORAL_TABLET | Freq: Three times a day (TID) | ORAL | Status: DC
  Administered 2021-09-02: 40 mg via ORAL
  Filled 2021-09-02 (×3): qty 1

## 2021-09-02 NOTE — Consult Note (Addendum)
Neurology Stroke Consult H&P  Kelly Richards MR# [id number] 09/02/2021   CC: Stroke  History is obtained from: patient and chart.  HPI: Kelly Richards is a 88 y.o. female PMHx as reviewed below, Alzheimer's disease developed dizziness like she was going to pass out but never actually passed out. She walked down stairs to tell her daughter and her son-in-law found her. Daughter arrived and they noted slurred speech and called EMS. She returned to baseline in ~1 hour but still felt a little dizzy as if she is about to pass out.  Patient denies LOC, falls, head trauma, headaches.    Some shortness of breath that is new for her.   Denies abdominal pain, urinary symptoms.   She stated that she used to have palpitations when she was younger.  LKW: unclear tNK given: No OSW IR Thrombectomy No, not indicated Modified Rankin Scale: 0-Completely asymptomatic and back to baseline post- stroke NIHSS: 0  ROS: A complete ROS was performed and is negative except as noted in the HPI.   Past Medical History:  Diagnosis Date   Hyperlipidemia    Hypertension      Family History  Problem Relation Age of Onset   Cancer Father     Social History:  reports that she has quit smoking. She has never used smokeless tobacco. She reports current alcohol use. She reports that she does not use drugs.   Prior to Admission medications   Medication Sig Start Date End Date Taking? Authorizing Provider  amLODipine (NORVASC) 5 MG tablet Take 5 mg by mouth daily. 02/06/20  Yes [provider]  aspirin EC 81 MG tablet Take 81 mg by mouth daily.   Yes [provider]  atorvastatin (LIPITOR) 20 MG tablet Take 20 mg by mouth daily.   Yes [provider]  metoprolol succinate (TOPROL-XL) 25 MG 24 hr tablet Take 25 mg by mouth daily. 05/05/21  Yes [provider]  traZODone (DESYREL) 50 MG tablet Take 50 mg by mouth at bedtime. 08/27/21  Yes [provider]  methocarbamol  (ROBAXIN) 500 MG tablet Take 500 mg by mouth 2 (two) times daily as needed. Patient not taking: Reported on 09/01/2021 02/18/20   [provider]  predniSONE (STERAPRED UNI-PAK 48 TAB) 10 MG (48) TBPK tablet Take by mouth as directed. Patient not taking: Reported on 09/01/2021 02/18/20   [provider]  traMADol (ULTRAM) 50 MG tablet Take 50 mg by mouth every 6 (six) hours as needed. Patient not taking: Reported on 09/01/2021 02/18/20   [provider]    Exam: Current vital signs: BP (!) 154/46 (BP Location: Right Arm)    Pulse (!) 59    Temp 97.9 F (36.6 C) (Oral)    Resp 16    Ht 5\' 5"  (1.651 m)    Wt 76 kg    SpO2 92%    BMI 27.88 kg/m   Physical Exam  Constitutional: Appears well-developed and well-nourished.  Psych: Affect appropriate to situation Eyes: No scleral injection HENT: No OP obstruction. Head: Normocephalic.  Cardiovascular: Normal rate and regular rhythm.  Respiratory: Effort normal, symmetric excursions bilaterally, no audible wheezing. GI: Soft.  No distension. There is no tenderness.  Skin: WDI  Neuro: Mental Status: Patient is awake, alert, oriented to person, place, month, year, and situation. Patient is able to give a clear and coherent history. Speech  fluent, intact comprehension and repetition. No signs of aphasia or neglect. Visual Fields are full. Pupils are equal, round,  and reactive to light. EOMI without ptosis or diploplia.  Facial sensation is symmetric to temperature Facial movement is symmetric.  Hearing is intact to voice. Uvula midline and palate elevates symmetrically. Shoulder shrug is symmetric. Tongue is midline without atrophy or fasciculations.  Tone is normal. Bulk is normal. 5/5 strength was present in all four extremities. Sensation is symmetric to light touch and temperature in the arms and legs. Deep Tendon Reflexes: 2+ and symmetric in the biceps and patellae. Toes are downgoing bilaterally. FNF and HKS  are intact bilaterally. Gait - Deferred  I have reviewed labs in epic and the pertinent results are: No results found for: HGBA1C  Lab Results  Component Value Date   LDLCALC 129 (H) 09/02/2021   Echocardiogram 09/02/2021 read as EF 55-60%, no WMA, no shunt, no mention of LVT.  Carotid ultrasound 09/02/2021 read as bilateral carotid bifurcation plaque resulting in less than 50% diameter ICA stenosis. Antegrade bilateral vertebral arterial flow.  I have reviewed the images obtained: MRI brain showed punctate focus of acute ischemia in the right cerebellum without hemorrhage or mass effect. Old bilateral cerebellar and scattered lacunar infractions in bilateral deep gray nuclei.  CTA head and neck showed marked stenosis in right cavernous ICA moderate stenosis of left supraclinoid and left mid M1. Focal stenotic plaques along the proximal left vertebral near the PICA origin and mild stenosis in right vertebral artery. Basilar artery is patent. Bilateral posterior communicating arteries. Mixed plaque at the right ICA origin causing 80% stenosis. Plaque at the left ICA origin causes less than 50% stenosis.   Assessment: Kelly Richards is a 88 y.o. female PMHx HTN, HLD, chronic ischemic strokes now with acute punctate embolic stroke in right cerebellum. He has scattered multifocal lacunar infractions which are most likely related to inadequately controlled hypertension. CTA head and neck showed stenotic plaques in both vertebral arteries L>>R which may be potential sources of current embolic stroke. She also mentioned that she had palpitations when she was younger and therefore cannot exclude more central source and may need Holter monitor. She has multifocal intracranial carotid artery stenoses bilaterally. She also has asymptomatic right ICA origin stenosis ~80% and needs to have aggressive medical management. She is relatively healthy and may also benefit from vascular surgery  evaluation.   Impression:  Acute punctate embolic stroke - Right cerebellum. Multifocal intracranial artery stenoses - Moderate to marked. Right ICA origin 80% stenosis. Chronic multifocal lacunar infarctions. HTN HLD  Plan: Aggressive medical management:  - LDL 129 elevated and recommend maximizing statin for target LDL<70. - Increase aspirin to 325mg  daily. - Continue clopidogrel 75mg  daily for 90 days. - Labs: HbA1c - Pending. - Telemetry monitoring for arrhythmia. - Recommend bedside Swallow screen. - Recommend Stroke education. - Recommend PT/OT/SLP consult. - She may need Holter monitoring. - She may benefit from vascular surgery evaluation which may be done in the outpatient setting if she can be seen soon.   Electronically signed by:  Lynnae Sandhoff, MD Page: [id number] 09/02/2021, 8:32 AM  If 7pm- 7am, please page neurology on call as listed in [place].

## 2021-09-02 NOTE — TOC Initial Note (Signed)
Transition of Care [facility]) - Initial/Assessment Note    Patient Details  Name: Kelly Richards MRN: [id number] Date of Birth: 1930-02-24  Transition of [facility]) CM/SW Contact:    Merrily Brittle, LCSWA Phone Number: 09/02/2021, 3:47 PM  Clinical Narrative:                 Patient will discharge home with home health services. CSW spoke with Kandee Keen [phone]) from [facility] and he accepted the patient for PT, OT, and home health aid services. Spoke to patient's daughter who has been updated. Patient's family will be providing transportation home.  Expected Discharge Plan: Home w Home Health Services Barriers to Discharge: No Barriers Identified   Patient Goals and CMS Choice Patient states their goals for this hospitalization and ongoing recovery are:: Return home      Expected Discharge Plan and Services Expected Discharge Plan: Home w Home Health Services   Discharge Planning Services: CM Consult   Living arrangements for the past 2 months: Single Family Home Expected Discharge Date: 09/02/21                         HH Arranged: PT, OT (Home Health Aide) HH Agency: [facility] Date [facility] Agency Contacted: 09/02/21 Time HH Agency Contacted: [phone] Representative spoke with at [facility] Agency: Kandee Keen  Prior Living Arrangements/Services Living arrangements for the past 2 months: Single Family Home Lives with:: Relatives Patient language and need for interpreter reviewed:: Yes Do you feel safe going back to the place where you live?: Yes      Need for Family Participation in Patient Care: Yes (Comment) Care giver support system in place?: Yes (comment)   Criminal Activity/Legal Involvement Pertinent to Current Situation/Hospitalization: No - Comment as needed  Activities of Daily Living      Permission Sought/Granted Permission sought to share information with : Family Supports Permission granted to share information with : Yes, Verbal Permission Granted  Share  Information with NAME: Ander Purpura     Permission granted to share info w Relationship: Daughter  Permission granted to share info w Contact Information: [phone]  Emotional Assessment Appearance:: Appears stated age     Orientation: : Oriented to Self Alcohol / Substance Use: Not Applicable Psych Involvement: No (comment)  Admission diagnosis:  Near syncope [R55] Patient Active Problem List   Diagnosis Date Noted   Dementia without behavioral disturbance (HCC) 09/02/2021   PVD (peripheral vascular disease) (HCC) 09/02/2021   Aortic valve stenosis    LVH (left ventricular hypertrophy)    Near syncope 09/01/2021   SVT (supraventricular tachycardia) (HCC) 09/01/2021   Essential hypertension 09/01/2021   CAD (coronary artery disease) 09/01/2021   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

## 2021-09-02 NOTE — Assessment & Plan Note (Addendum)
Periodic non-atrial fibrillation SVTs in the hospital.  Evaluated by Cardiology, Echo unremarkable, started on verapamil 120, has follow up with Cardiology. - Neurology recommend outpatient cardiac monitor

## 2021-09-02 NOTE — ED Notes (Signed)
Pt alert at this time.   No needs or requests expressed.  Pt encourage to notify RN of any needs.

## 2021-09-02 NOTE — Evaluation (Signed)
Physical Therapy Evaluation Patient Details Name: Kelly Richards MRN: [id number] DOB: [birth year] Today's Date: 09/02/2021  History of Present Illness  Patient is a 88 year old female who presents to ED for syncope. She walked down the stairs to tell her daughter and almost passed out, had slurred speech for one hour that resolved. PMH includes atrial tachycardia, HTN, CAD, HLD, CVA, Alzheimer's disease, back pain.Head imaging with brain MRI showed acute ischemic right cerebellar stroke.  CT angio neck showed 80% right ICA stenosis, left ICA less than 50%.   Clinical Impression  Patient is a pleasant 88 year old female who presents with generalized weakness and instability. Prior to hospital admission, pt was independent with ADLs and mobility, occasionally using a walker when out in the community and lives with her daughter's family on the first floor in the house.  Patient is in bed upon PT arrival and agreeable to participate in therapy. She is able to transition to EOB without assistance. She is educated on taking extra time between transitions to decrease dizziness and to focus on five deep breaths before standing and before ambulation. Patient ambulated with RW and CGA, initially with a shuffle step that improved as patient became more confident with ambulation. Patient is fearful of LOB throughout session. She is returned to bed with needs met.  Pt would benefit from skilled PT to address noted impairments and functional limitations (see below for any additional details).  Upon hospital discharge, pt would benefit from home health PT and constant supervision/aide to improve her mobility, stability, and quality of life.        Recommendations for follow up therapy are one component of a multi-disciplinary discharge planning process, led by the attending physician.  Recommendations may be updated based on patient status, additional functional criteria and insurance authorization.  Follow Up  Recommendations Home health PT    Assistance Recommended at Discharge Frequent or constant Supervision/Assistance  Patient can return home with the following  A little help with walking and/or transfers;Assistance with cooking/housework;Help with stairs or ramp for entrance;Direct supervision/assist for medications management    Equipment Recommendations Rolling walker (2 wheels)  Recommendations for Other Services       Functional Status Assessment Patient has had a recent decline in their functional status and demonstrates the ability to make significant improvements in function in a reasonable and predictable amount of time.     Precautions / Restrictions Precautions Precautions: Fall Restrictions Weight Bearing Restrictions: No      Mobility  Bed Mobility Overal bed mobility: Modified Independent             General bed mobility comments: requires extra time but able to perform without assistance    Transfers Overall transfer level: Modified independent Equipment used: Rolling walker (2 wheels)               General transfer comment: CGA only , patient cued for five breaths to reduce dizziness.    Ambulation/Gait Ambulation/Gait assistance: Min guard Gait Distance (Feet): 40 Feet Assistive device: Rolling walker (2 wheels) Gait Pattern/deviations: Step-through pattern;Shuffle;Narrow base of support Gait velocity: decreased     General Gait Details: patient has slow shuffle steps, is fearful of falling initially, improved with increased duration of ambulation  Stairs            Wheelchair Mobility    Modified Rankin (Stroke Patients Only)       Balance Overall balance assessment: Needs assistance Sitting-balance support: Single extremity supported;Feet unsupported Sitting balance-Leahy  Scale: Fair Sitting balance - Comments: able to sit and perform LE strengthening tasks   Standing balance support: Bilateral upper extremity  supported;Reliant on assistive device for balance;During functional activity Standing balance-Leahy Scale: Fair Standing balance comment: Patient relies heavily on RW with ambulation however partially is due to fear of LOB.                             Pertinent Vitals/Pain Pain Assessment: No/denies pain    Home Living Family/patient expects to be discharged to:: Private residence Living Arrangements: Children Available Help at Discharge: Family Type of Home: House Home Access: Stairs to enter   Technical brewer of Steps: 1   Home Layout: Two level;Able to live on main level with bedroom/bathroom Home Equipment: Grab bars - tub/shower;Shower Land (2 wheels) Additional Comments: Patient reports she is independent at baseline, doesn't use her walker in the house but uses it outside and at the grocery store    Prior Function Prior Level of Function : Independent/Modified Independent             Mobility Comments: Patient reports she uses a walker when she is at the store or outside but doesn't use it in the house ADLs Comments: Patient reports she is independent with ADLS     Hand Dominance        Extremity/Trunk Assessment   Upper Extremity Assessment Upper Extremity Assessment: Overall WFL for tasks assessed    Lower Extremity Assessment Lower Extremity Assessment: Generalized weakness (grossly 4/5 bilaterally)    Cervical / Trunk Assessment Cervical / Trunk Assessment: Normal  Communication   Communication: No difficulties  Cognition Arousal/Alertness: Awake/alert Behavior During Therapy: WFL for tasks assessed/performed Overall Cognitive Status: History of cognitive impairments - at baseline (history of Alzheimers)                                 General Comments: A and O x 4, able to follow simple and complex commands, relay history.        General Comments General comments (skin integrity, edema, etc.):  Patient appears well nourished and groomed. Multiple bruises on LE's    Exercises Other Exercises Other Exercises: Patient educated on role of PT in acute care setting, safe mobility and transfers, taking time between transitions to reduce dizziness.   Assessment/Plan    PT Assessment Patient needs continued PT services  PT Problem List Decreased strength;Decreased activity tolerance;Decreased balance;Decreased mobility       PT Treatment Interventions DME instruction;Gait training;Stair training;Functional mobility training;Therapeutic activities;Therapeutic exercise;Manual techniques;Patient/family education;Neuromuscular re-education;Balance training    PT Goals (Current goals can be found in the Care Plan section)  Acute Rehab PT Goals Patient Stated Goal: to be more steady PT Goal Formulation: With patient Time For Goal Achievement: 09/16/21 Potential to Achieve Goals: Fair    Frequency Min 2X/week     Co-evaluation               AM-PAC PT "6 Clicks" Mobility  Outcome Measure Help needed turning from your back to your side while in a flat bed without using bedrails?: None Help needed moving from lying on your back to sitting on the side of a flat bed without using bedrails?: A Little Help needed moving to and from a bed to a chair (including a wheelchair)?: A Little Help needed standing up from a chair using your  arms (e.g., wheelchair or bedside chair)?: A Little Help needed to walk in hospital room?: A Little Help needed climbing 3-5 steps with a railing? : A Little 6 Click Score: 19    End of Session Equipment Utilized During Treatment: Gait belt Activity Tolerance: Patient tolerated treatment well Patient left: in bed;with call bell/phone within reach Nurse Communication: Mobility status PT Visit Diagnosis: Unsteadiness on feet (R26.81);Other abnormalities of gait and mobility (R26.89);Muscle weakness (generalized) (M62.81);History of falling  (Z91.81);Difficulty in walking, not elsewhere classified (R26.2);Dizziness and giddiness (R42)    Time: 9937-1696 PT Time Calculation (min) (ACUTE ONLY): 14 min   Charges:   PT Evaluation $PT Eval Low Complexity: 1 Low         Janna Arch, PT, DPT  09/02/2021, 4:35 PM

## 2021-09-02 NOTE — ED Notes (Signed)
This RN assisted pt to ambulate to toilet.

## 2021-09-02 NOTE — Assessment & Plan Note (Signed)
Either from SVT or more likely from stroke.  No further episodes in hospital.  Worked with PT without symptoms of dizziness.

## 2021-09-02 NOTE — Hospital Course (Signed)
Kelly Richards is an 88 y.o. F with dementia, lives at home, HTN, CAD s/p PCI >5 years, hx stroke, who presented with pre-syncopal episode.  Evidently was at home, felt dizzzy and lightheaded, almost passed out.  Walked downstairs and family noticed she had slurred speech for 1 hr, so called EMS.  In the ER, CTH normal, noted to have some SVT with T wave inversion in lateral leads.  Minimally elevated troponin.   Admitted for suspected TIA --> overnight, MRI brain confirmed subcentimeter R cerebellar stroke.

## 2021-09-02 NOTE — ED Notes (Signed)
Pt taken to MRI at this time

## 2021-09-02 NOTE — Assessment & Plan Note (Addendum)
-  Non-invasive angiography showed multifocal atherosclerosis but only posterior circulation lesions are mild plaque in vertebral artery, suspected embolic source -Echocardiogram showed no cardiogenic source of embolism -Carotid imaging unremarkable   -Lipids ordered: discharged on atorvastatin -Aspirin ordered at admission --> discharged on aspirin 325 mg daily plus Plavix for 3 months -Atrial fibrillation: not present on monitoring, recommend outaptient monitor -tPA not given because symptoms resolved by presentation -Dysphagia screen ordered in ER -PT eval ordered: recommended HHPT -Smoking cessation: not pertinent

## 2021-09-02 NOTE — Consult Note (Signed)
Cardiology Consultation:   Kelly ID: Kelly Richards MRN: [id number]; DOB: 02-15-1930  Admit date: 09/01/2021 Date of Consult: 09/02/2021  PCP:  Orene DesanctisBehling, Karen, MD   [facility] Providers Cardiologist:  Julien Nordmannimothy Gollan, MD        Kelly Profile:   Kelly Richards is a 88 y.o. female with a hx of hypertension, hyperlipidemia, hiatal hernia s/p repair who is being seen 09/02/2021 for the evaluation of tachycardia at the request of Dr. Fuller PlanFunke.  History of Present Illness:   Kelly Richards is an 88 year old female with history of hypertension, hyperlipidemia, dementia, hyperlipoidemia s/p repair 2021,  carotid stenosis, dementia who presents to the hospital due to presyncope and slurred speech.  Kelly was at home yesterday when she states feeling very dizzy, lightheaded.  States she almost passed out.  This is happened several times before.  Her speech was also slurred, noticed by family who called EMS.  Symptoms of slurred speech resolved by the time Kelly got to the emergency room.  She states feeling weak during my exam.  Initial EKG showed sinus rhythm, subsequent telemetry monitor showed atrial tachycardia.  Atrial tachycardia was confirmed on EKG obtained about 4 hours later.  Case discussed with the ED physician, I recommended starting Lopressor 25 mg twice daily.  Kelly previously to Toprol-XL according to reports/EMR due to paroxysmal SVT after hiatal hernia surgery in 2021.  Head imaging with brain MRI showed acute ischemic right cerebellar stroke.  CT angio neck showed 80% right ICA stenosis, left ICA less than 50%.   Past Medical History:  Diagnosis Date   Hyperlipidemia    Hypertension     Past Surgical History:  Procedure Laterality Date   TOTAL HIP ARTHROPLASTY       Home Medications:  Prior to Admission medications   Medication Sig Start Date End Date Taking? Authorizing Provider  amLODipine (NORVASC) 5 MG tablet Take 5 mg by mouth daily. 02/06/20  Yes [provider]  aspirin EC 81 MG tablet Take 81 mg by mouth daily.   Yes [provider]  atorvastatin (LIPITOR) 20 MG tablet Take 20 mg by mouth daily.   Yes [provider]  metoprolol succinate (TOPROL-XL) 25 MG 24 hr tablet Take 25 mg by mouth daily. 05/05/21  Yes [provider]  traZODone (DESYREL) 50 MG tablet Take 50 mg by mouth at bedtime. 08/27/21  Yes [provider]  methocarbamol (ROBAXIN) 500 MG tablet Take 500 mg by mouth 2 (two) times daily as needed. Kelly not taking: Reported on 09/01/2021 02/18/20   [provider]  predniSONE (STERAPRED UNI-PAK 48 TAB) 10 MG (48) TBPK tablet Take by mouth as directed. Kelly not taking: Reported on 09/01/2021 02/18/20   [provider]  traMADol (ULTRAM) 50 MG tablet Take 50 mg by mouth every 6 (six) hours as needed. Kelly not taking: Reported on 09/01/2021 02/18/20   [provider]    Inpatient Medications: Scheduled Meds:   stroke: mapping our early stages of recovery book   Does not apply Once   aspirin EC  81 mg Oral Daily   atorvastatin  20 mg Oral Daily   enoxaparin (LOVENOX) injection  40 mg Subcutaneous Q24H   metoprolol tartrate  25 mg Oral BID   sodium chloride flush  3 mL Intravenous Q12H   sodium chloride flush  3 mL Intravenous Q12H   traZODone  50 mg Oral QHS   verapamil  40 mg Oral Q8H   Continuous Infusions:  sodium chloride  PRN Meds: sodium chloride, ibuprofen, ondansetron **OR** ondansetron (ZOFRAN) IV, sodium chloride flush  Allergies:    Allergies  Allergen Reactions   Lisinopril Cough    Social History:   Social History   Socioeconomic History   Marital status: Widowed    Spouse name: Not on file   Number of children: Not on file   Years of education: Not on file   Highest education level: Not on file  Occupational History   Not on file  Tobacco Use   Smoking status: Former   Smokeless tobacco: Never  Substance and Sexual Activity    Alcohol use: Yes   Drug use: No   Sexual activity: Not Currently  Other Topics Concern   Not on file  Social History Narrative   Not on file   Social Determinants of Health   Financial Resource Strain: Not on file  Food Insecurity: Not on file  Transportation Needs: Not on file  Physical Activity: Not on file  Stress: Not on file  Social Connections: Not on file  Intimate Partner Violence: Not on file    Family History:    Family History  Problem Relation Age of Onset   Cancer Father      ROS:  Please see the history of present illness.   All other ROS reviewed and negative.     Physical Exam/Data:   Vitals:   09/02/21 0743 09/02/21 0922 09/02/21 0930 09/02/21 1000  BP: (!) 154/46 (!) 135/91 (!) 151/87 (!) 149/87  Pulse: (!) 59 (!) 128 63 66  Resp: 16  16 20   Temp:      TempSrc:      SpO2: 92%  98% 100%  Weight:      Height:        Intake/Output Summary (Last 24 hours) at 09/02/2021 1231 Last data filed at 09/01/2021 2251 Gross per 24 hour  Intake 6 ml  Output --  Net 6 ml   Last 3 Weights 09/01/2021 09/17/2020 02/19/2020  Weight (lbs) 167 lb 8.8 oz 167 lb 8.8 oz 167 lb 8.8 oz  Weight (kg) 76 kg 76 kg 76 kg     Body mass index is 27.88 kg/m.  General: Appears frail, soft-spoken, no acute distress HEENT: normal Neck: no JVD Cardiac:  normal S1, S2; RRR; 2/6 systolic murmur Lungs: Lungs are clear, no wheezing Abd: soft, nontender, no hepatomegaly  Ext: no edema Musculoskeletal:  No deformities, BUE and BLE strength normal and equal Skin: warm and dry  Neuro:  CNs 2-12 intact, no focal abnormalities noted Psych:  Normal affect alert to person and place, not to time  EKG:  The EKG was personally reviewed and demonstrates: Atrial tachycardia Telemetry:  Telemetry was personally reviewed and demonstrates: Sinus rhythm, paroxysmal atrial tachycardia  Relevant CV Studies: TTE 09/02/2021 1. Left ventricular ejection fraction, by estimation, is 55 to 60%.  The  left ventricle has normal function. The left ventricle has no regional  wall motion abnormalities. There is severe asymmetric left ventricular  hypertrophy of the septal segment. Left   ventricular diastolic parameters are consistent with Grade I diastolic  dysfunction (impaired relaxation).   2. Right ventricular systolic function is normal. The right ventricular  size is normal. There is normal pulmonary artery systolic pressure.   3. Left atrial size was mildly dilated.   4. The mitral valve is degenerative. Mild mitral valve regurgitation.   5. Aortic valve doppler images suboptimal for adequate assessment of  aortic valve hemodynamics.. The aortic  valve is bicuspid. Aortic valve  regurgitation is not visualized. Mild to moderate aortic valve stenosis.   6. The inferior vena cava is normal in size with <50% respiratory  variability, suggesting right atrial pressure of 8 mmHg.  Laboratory Data:  High Sensitivity Troponin:   Recent Labs  Lab 09/01/21 1013  TROPONINIHS 39*     Chemistry Recent Labs  Lab 09/01/21 1005 09/01/21 1013 09/02/21 0645  NA 138  --  137  K 3.8  --  3.5  CL 109  --  108  CO2 22  --  20*  GLUCOSE 93  --  103*  BUN 16  --  12  CREATININE 0.77  --  0.73  CALCIUM 9.1  --  9.1  MG  --  2.2  --   GFRNONAA >60  --  >60  ANIONGAP 7  --  9    Recent Labs  Lab 09/02/21 0645  PROT 6.6  ALBUMIN 3.7  AST 13*  ALT 9  ALKPHOS 62  BILITOT 0.9   Lipids  Recent Labs  Lab 09/02/21 0900  CHOL 223*  TRIG 69  HDL 80  LDLCALC 129*  CHOLHDL 2.8    Hematology Recent Labs  Lab 09/01/21 1005 09/02/21 0645  WBC 5.0 5.3  RBC 4.21 4.43  HGB 12.9 13.4  HCT 40.0 40.8  MCV 95.0 92.1  MCH 30.6 30.2  MCHC 32.3 32.8  RDW 13.4 13.2  PLT 240 241   Thyroid  Recent Labs  Lab 09/01/21 1013  TSH 0.625  FREET4 0.84    BNPNo results for input(s): BNP, PROBNP in the last 168 hours.  DDimer  Recent Labs  Lab 09/01/21 1537  DDIMER 1.46*      Radiology/Studies:  CT ANGIO HEAD NECK W WO CM  Result Date: 09/02/2021 CLINICAL DATA:  Stroke, follow up acute punctate R cerebellar stroke. EXAM: CT ANGIOGRAPHY HEAD AND NECK TECHNIQUE: Multidetector CT imaging of the head and neck was performed using the standard protocol during bolus administration of intravenous contrast. Multiplanar CT image reconstructions and MIPs were obtained to evaluate the vascular anatomy. Carotid stenosis measurements (when applicable) are obtained utilizing NASCET criteria, using the distal internal carotid diameter as the denominator. RADIATION DOSE REDUCTION: This exam was performed according to the departmental dose-optimization program which includes automated exposure control, adjustment of the mA and/or kV according to Kelly size and/or use of iterative reconstruction technique. CONTRAST:  75mL OMNIPAQUE IOHEXOL 350 MG/ML SOLN COMPARISON:  CT head 09/01/2021 FINDINGS: CT HEAD Brain: There is no acute intracranial hemorrhage, mass effect, or edema. Gray-white differentiation is preserved. There is no extra-axial fluid collection. Ventricles and sulci are stable in size and configuration. Asymmetric left temporal volume loss. Stable findings of probable chronic microvascular ischemic changes. Small chronic infarcts of the left thalamus, right basal ganglia, and right cerebellum again identified. The small recent right cerebellar infarct on MRI is not visible by CT. Vascular: No new abnormality. Skull: Calvarium is unremarkable. Sinuses/Orbits: No acute finding. Other: None. Review of the MIP images confirms the above findings CTA NECK Aortic arch: Mild calcified plaque along aortic arch. Great vessel origins are patent. Right carotid system: Patent. Mixed plaque at the ICA origin causing 80% stenosis. Left carotid system: Patent. Partial obscuration the proximal common carotid by artifact from adjacent venous contrast. Calcified plaque at the ICA origin with less than  50% stenosis. Vertebral arteries: Patent. Right vertebral is dominant. No stenosis. Skeleton: Degenerative changes of the included spine. Other neck: Unremarkable.  Upper chest: No apical lung mass. Review of the MIP images confirms the above findings CTA HEAD Anterior circulation: Intracranial internal carotid arteries are patent. Calcified plaque is present. There is up to marked stenosis on the right at the proximal genu of the cavernous portion. Up to moderate stenosis on the left in the supraclinoid region. Anterior cerebral arteries are patent. 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 Anterior communicating artery is present. Middle cerebral arteries are patent. Moderate stenosis mid left M1 MCA. Posterior circulation: Intracranial vertebral arteries are patent. There is focal plaque along the proximal left vertebral near the PICA origin with stenosis of already congenitally small caliber vessel. Plaque along the right vertebral causes mild stenosis. Basilar artery is patent. Bilateral posterior communicating arteries are present. Posterior cerebral arteries are patent. Venous sinuses: Patent as allowed by contrast bolus timing. Review of the MIP images confirms the above findings IMPRESSION: No acute intracranial abnormality. Small right cerebellar infarct on MRI is not visible. Mixed plaque at the right ICA origin causing 80% stenosis. Plaque at the left ICA origin causes less than 50% stenosis. Bilateral intracranial ICA atherosclerosis. Up to marked stenosis on the right and moderate stenosis on the left. Moderate stenosis mid left M1 MCA. Electronically Signed   By: Guadlupe Spanish M.D.   On: 09/02/2021 12:18   DG Abd 1 View  Result Date: 09/02/2021 CLINICAL DATA:  MRI clearance. EXAM: ABDOMEN - 1 VIEW COMPARISON:  None. FINDINGS: The bowel gas pattern is normal. No radio-opaque calculi or other significant radiographic abnormality are seen. An intact total right hip replacement is seen. Radiopaque contrast  is seen within the urinary bladder and renal collecting system on the left. A partially calcified uterine fibroid is suspected on the left. No radiopaque soft tissue foreign bodies are identified. IMPRESSION: 1. No radiopaque soft tissue foreign bodies identified. 2. Intact total right hip replacement. Electronically Signed   By: Aram Candela M.D.   On: 09/02/2021 00:08   CT HEAD WO CONTRAST ( )  Result Date: 09/01/2021 CLINICAL DATA:  Syncope.  History of dementia. EXAM: CT HEAD WITHOUT CONTRAST TECHNIQUE: Contiguous axial images were obtained from the base of the skull through the vertex without intravenous contrast. RADIATION DOSE REDUCTION: This exam was performed according to the departmental dose-optimization program which includes automated exposure control, adjustment of the mA and/or kV according to Kelly size and/or use of iterative reconstruction technique. COMPARISON:  Head CT, 09/17/2020. FINDINGS: Brain: No evidence of acute infarction, hemorrhage, hydrocephalus, extra-axial collection or mass lesion/mass effect. Ventricular and sulcal enlargement reflecting volume loss, without change from the prior study. Old left thalamic and right basal ganglia lacunar infarcts. Patchy areas of white matter hypoattenuation are noted consistent with moderate chronic microvascular ischemic change. These findings are stable. Vascular: No hyperdense vessel or unexpected calcification. Skull: Normal. Negative for fracture or focal lesion. Sinuses/Orbits: Visualized globes and orbits are unremarkable. Visualized sinuses are clear. Other: None. IMPRESSION: 1. No acute intracranial abnormalities. 2. Age related volume loss, old lacunar infarcts and moderate chronic microvascular ischemic change. Electronically Signed   By: Amie Portland M.D.   On: 09/01/2021 17:28   CT Angio Chest PE W and/or Wo Contrast  Result Date: 09/01/2021 CLINICAL DATA:  Syncope. History of dementia. PACs on cardiac monitoring.  EXAM: CT ANGIOGRAPHY CHEST WITH CONTRAST TECHNIQUE: Multidetector CT imaging of the chest was performed using the standard protocol during bolus administration of intravenous contrast. Multiplanar CT image reconstructions and MIPs were obtained to evaluate the vascular anatomy. RADIATION  DOSE REDUCTION: This exam was performed according to the departmental dose-optimization program which includes automated exposure control, adjustment of the mA and/or kV according to Kelly size and/or use of iterative reconstruction technique. CONTRAST:  75mL OMNIPAQUE IOHEXOL 350 MG/ML SOLN COMPARISON:  02/19/2020. FINDINGS: Cardiovascular: Pulmonary arteries are well opacified. There is no evidence of a pulmonary embolism. Heart is mildly enlarged. Trace pericardial effusion. Left and right coronary artery calcifications. Great vessels are normal in caliber. Aorta is not opacified. There are aortic atherosclerotic calcifications. Mediastinum/Nodes: No neck base, mediastinal or hilar masses or pathologically enlarged lymph nodes. Trachea and esophagus are unremarkable. Lungs/Pleura: Minor subsegmental atelectasis at the lung bases. 3 mm nodule, right upper lobe, subpleural, image 26, series 6, stable from the prior CT, benign. No other nodules. Lungs otherwise clear. Stable eventration of the left hemidiaphragm. No pleural effusion or pneumothorax. Upper Abdomen: No acute abnormality. Musculoskeletal: No fracture or acute finding. No bone lesion. No chest wall masses. Review of the MIP images confirms the above findings. IMPRESSION: 1. No evidence of a pulmonary embolism. 2. No acute findings.  No evidence of pneumonia or pulmonary edema. 3. Aortic arthrosclerosis. Two vessel coronary artery calcification, mild cardiomegaly and trace pericardial effusion. Aortic Atherosclerosis (ICD10-I70.0). Electronically Signed   By: Amie Portland M.D.   On: 09/01/2021 17:35   MR BRAIN WO CONTRAST  Result Date: 09/02/2021 CLINICAL DATA:   Transient ischemic attack EXAM: MRI HEAD WITHOUT CONTRAST TECHNIQUE: Multiplanar, multiecho pulse sequences of the brain and surrounding structures were obtained without intravenous contrast. COMPARISON:  None. FINDINGS: Brain: Punctate focus of acute ischemia in the right cerebellum. No other diffusion abnormality. No acute or chronic hemorrhage. Hyperintense T2-weighted signal is moderately widespread throughout the white matter. Generalized volume loss without a clear lobar predilection. Old bilateral cerebellar and deep gray nuclei small vessel infarcts. The midline structures are normal. Vascular: Major flow voids are preserved. Skull and upper cervical spine: Normal calvarium and skull base. Visualized upper cervical spine and soft tissues are normal. Sinuses/Orbits:No paranasal sinus fluid levels or advanced mucosal thickening. No mastoid or middle ear effusion. Normal orbits. IMPRESSION: 1. Punctate focus of acute ischemia in the right cerebellum. No hemorrhage or mass effect. 2. Old bilateral cerebellar and deep gray nuclei small vessel infarcts. Electronically Signed   By: Deatra Robinson M.D.   On: 09/02/2021 01:03   US Carotid Bilateral  Result Date: 09/02/2021 CLINICAL DATA:  TIA.  Hypertension, syncope, hyperlipidemia EXAM: BILATERAL CAROTID DUPLEX ULTRASOUND TECHNIQUE: Wallace Cullens scale imaging, color Doppler and duplex ultrasound were performed of bilateral carotid and vertebral arteries in the neck. COMPARISON:  None. FINDINGS: Criteria: Quantification of carotid stenosis is based on velocity parameters that correlate the residual internal carotid diameter with NASCET-based stenosis levels, using the diameter of the distal internal carotid lumen as the denominator for stenosis measurement. The following velocity measurements were obtained: RIGHT ICA: 96/25 cm/sec CCA: 34/10 cm/sec SYSTOLIC ICA/CCA RATIO:  2.8 ECA: 79 cm/sec LEFT ICA: 58/20 cm/sec CCA: 41/10 cm/sec SYSTOLIC ICA/CCA RATIO:  1.4 ECA: 41/10  cm/sec RIGHT CAROTID ARTERY: Mild tortuosity. Partially calcified eccentric somewhat irregular plaque in the bulb and proximal ICA and ECA, resulting in at least mild stenosis. Normal waveforms and color Doppler signal throughout. RIGHT VERTEBRAL ARTERY:  Normal flow direction and waveform. LEFT CAROTID ARTERY: Intimal thickening in the common carotid artery. Mild eccentric plaque in the bulb, and proximal ICA and ECA, resulting in at least mild stenosis. LEFT VERTEBRAL ARTERY:  Normal flow direction and waveform. IMPRESSION: 1.  Bilateral carotid bifurcation plaque resulting in less than 50% diameter ICA stenosis. 2. Antegrade bilateral vertebral arterial flow. Electronically Signed   By: Corlis Leak M.D.   On: 09/02/2021 10:20   DG Chest Portable 1 View  Result Date: 09/01/2021 CLINICAL DATA:  Shortness of breath. EXAM: PORTABLE CHEST 1 VIEW COMPARISON:  September 17, 2020 FINDINGS: Large hiatal hernia.  Chronic elevation of left hemidiaphragm. Cardiomediastinal silhouette is normal. Mediastinal contours appear intact. There is no evidence of focal airspace consolidation, pleural effusion or pneumothorax. Osseous structures are without acute abnormality. Soft tissues are grossly normal. IMPRESSION: Large hiatal hernia. No evidence of active cardiopulmonary disease. Electronically Signed   By: Ted Mcalpine M.D.   On: 09/01/2021 16:29   ECHOCARDIOGRAM COMPLETE  Result Date: 09/02/2021    ECHOCARDIOGRAM REPORT   Kelly Richards Date of Exam: 09/02/2021 Medical Rec #:  [id number]    Height:       65.0 in Accession #:    [id number]   Weight:       167.5 lb Date of Birth:  May 23, 1930    BSA:          1.835 m Kelly Age:    88 years     BP:           143/102 mmHg Kelly Gender: F            HR:           80 bpm. Exam Location:  [facility] Procedure: 2D Echo Indications:     TIA  History:         Kelly has no prior history of Echocardiogram examinations.                  Risk Factors:Hypertension and  Dyslipidemia.  Sonographer:     L Thornton-Maynard Referring Phys:  [id number] HRCBUL G CRISTESCU Diagnosing Phys: Debbe Odea MD IMPRESSIONS  1. Left ventricular ejection fraction, by estimation, is 55 to 60%. The left ventricle has normal function. The left ventricle has no regional wall motion abnormalities. There is severe asymmetric left ventricular hypertrophy of the septal segment. Left  ventricular diastolic parameters are consistent with Grade I diastolic dysfunction (impaired relaxation).  2. Right ventricular systolic function is normal. The right ventricular size is normal. There is normal pulmonary artery systolic pressure.  3. Left atrial size was mildly dilated.  4. The mitral valve is degenerative. Mild mitral valve regurgitation.  5. Aortic valve doppler images suboptimal for adequate assessment of aortic valve hemodynamics.. The aortic valve is bicuspid. Aortic valve regurgitation is not visualized. Mild to moderate aortic valve stenosis.  6. The inferior vena cava is normal in size with <50% respiratory variability, suggesting right atrial pressure of 8 mmHg. FINDINGS  Left Ventricle: Left ventricular ejection fraction, by estimation, is 55 to 60%. The left ventricle has normal function. The left ventricle has no regional wall motion abnormalities. The left ventricular internal cavity size was normal in size. There is  severe asymmetric left ventricular hypertrophy of the septal segment. Left ventricular diastolic parameters are consistent with Grade I diastolic dysfunction (impaired relaxation). Right Ventricle: The right ventricular size is normal. No increase in right ventricular wall thickness. Right ventricular systolic function is normal. There is normal pulmonary artery systolic pressure. The tricuspid regurgitant velocity is 2.28 m/s, and  with an assumed right atrial pressure of 3 mmHg, the estimated right ventricular systolic pressure is 23.8 mmHg. Left Atrium: Left atrial size was  mildly dilated.  Right Atrium: Right atrial size was normal in size. Pericardium: There is no evidence of pericardial effusion. Mitral Valve: The mitral valve is degenerative in appearance. Mild to moderate mitral annular calcification. Mild mitral valve regurgitation. MV peak gradient, 5.7 mmHg. The mean mitral valve gradient is 1.0 mmHg. Tricuspid Valve: The tricuspid valve is not well visualized. Tricuspid valve regurgitation is not demonstrated. Aortic Valve: Aortic valve doppler images suboptimal for adequate assessment of aortic valve hemodynamics. The aortic valve is bicuspid. Aortic valve regurgitation is not visualized. Mild to moderate aortic stenosis is present. Aortic valve mean gradient  measures 14.0 mmHg. Aortic valve peak gradient measures 25.0 mmHg. Aortic valve area, by VTI measures 0.72 cm. Pulmonic Valve: The pulmonic valve was not well visualized. Pulmonic valve regurgitation is trivial. Aorta: The aortic root and ascending aorta are structurally normal, with no evidence of dilitation. Venous: The inferior vena cava is normal in size with less than 50% respiratory variability, suggesting right atrial pressure of 8 mmHg. IAS/Shunts: No atrial level shunt detected by color flow Doppler.  LEFT VENTRICLE PLAX 2D LVIDd:         3.45 cm     Diastology LVIDs:         2.50 cm     LV e' medial:    1.80 cm/s LV PW:         1.30 cm     LV E/e' medial:  23.1 LV IVS:        1.70 cm     LV e' lateral:   4.95 cm/s LVOT diam:     1.80 cm     LV E/e' lateral: 8.4 LV SV:         35 LV SV Index:   19 LVOT Area:     2.54 cm  LV Volumes (MOD) LV vol d, MOD A2C: 37.1 ml LV vol d, MOD A4C: 23.8 ml LV vol s, MOD A2C: 9.7 ml LV vol s, MOD A4C: 7.5 ml LV SV MOD A2C:     27.4 ml LV SV MOD A4C:     23.8 ml LV SV MOD BP:      21.8 ml RIGHT VENTRICLE             IVC RV S prime:     12.30 cm/s  IVC diam: 1.20 cm TAPSE (M-mode): 1.5 cm LEFT ATRIUM             Index LA diam:        3.30 cm 1.80 cm/m LA Vol (A2C):   53.1 ml  28.94 ml/m LA Vol (A4C):   59.5 ml 32.43 ml/m LA Biplane Vol: 58.4 ml 31.83 ml/m  AORTIC VALVE                     PULMONIC VALVE AV Area (Vmax):    0.66 cm      PV Vmax:       0.90 m/s AV Area (Vmean):   0.73 cm      PV Peak grad:  3.2 mmHg AV Area (VTI):     0.72 cm AV Vmax:           250.00 cm/s AV Vmean:          173.000 cm/s AV VTI:            0.480 m AV Peak Grad:      25.0 mmHg AV Mean Grad:      14.0 mmHg LVOT Vmax:  65.30 cm/s LVOT Vmean:        49.300 cm/s LVOT VTI:          0.136 m LVOT/AV VTI ratio: 0.28  AORTA Ao Root diam: 3.30 cm Ao Asc diam:  3.20 cm MITRAL VALVE                TRICUSPID VALVE MV Area (PHT): 2.18 cm     TR Peak grad:   20.8 mmHg MV Area VTI:   1.34 cm     TR Vmax:        228.00 cm/s MV Peak grad:  5.7 mmHg MV Mean grad:  1.0 mmHg     SHUNTS MV Vmax:       1.19 m/s     Systemic VTI:  0.14 m MV Vmean:      52.8 cm/s    Systemic Diam: 1.80 cm MV Decel Time: 348 msec MV E velocity: 41.60 cm/s MV A velocity: 105.00 cm/s MV E/A ratio:  0.40 Debbe Odea MD Electronically signed by Debbe Odea MD Signature Date/Time: 09/02/2021/9:31:41 AM    Final      Assessment and Plan:   Paroxysmal atrial tachycardia -Telemetry with occasional runs of atrial tachycardia, although improved -Continue Lopressor 25 mg twice daily -Start verapamil 40 mg 3 times daily-plan to switch to long-acting after adequate arrhythmia control. -Echo with preserved EF  2.  Moderate aortic stenosis -Etiology for systolic murmur -Continue to monitor serially  3.  Severe assymmetric LVH -No LVOT gradient. beta-blocker and verapamil as above -Kelly not candidate for any invasive testing or procedures.    4.  Acute cerebellar infarct, significant right carotid artery stenosis -Aspirin, Lipitor -Recommend neurologic input.  Vascular surgery input -Likely not a candidate for invasive procedures or surgery due to comorbidities. -Management as per primary team   Total encounter  time more than 110 minutes  Greater than 50% was spent in counseling and coordination of care with the [facility]   Signed, Debbe Odea, MD  09/02/2021 12:31 PM

## 2021-09-02 NOTE — ED Notes (Signed)
Pt returned from MRI at this time

## 2021-09-02 NOTE — Discharge Summary (Signed)
Physician Discharge Summary   Patient: Kelly Richards MRN: [id number] DOB: 1930-02-17  Admit date:     09/01/2021  Discharge date: 09/02/21  Discharge Physician: Alberteen Samhristopher P Waylan Busta   PCP: Orene DesanctisBehling, Karen, MD   Recommendations at discharge:   Follow up with PCP Dr. Richardine ServiceBehling in 1 week and evaluate on new verapamil for SVT management as well as new stroke Follow up with Neurology Dr. Sherryll BurgerShah in 4-6 weeks for new stroke Follow up with Cardiology Dr. Mariah MillingGollan for SVT in 2 weeks Dr. Mariah MillingGollan: Please arrange outpatient cardiac event monitor, given SVT and new stroke Dr. 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 F with dementia, lives at home, HTN, CAD s/p PCI >5 years, hx stroke, who presented with pre-syncopal episode.  Evidently was at home, felt dizzzy and lightheaded, almost passed out.  Walked downstairs and family noticed she had slurred speech for 1 hr, so called EMS.  In the ER, CTH normal, noted to have some SVT with T wave inversion in lateral leads.  Minimally elevated troponin.   Admitted for suspected TIA --> overnight, MRI brain confirmed subcentimeter R cerebellar stroke.       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 (HCC)- (present on admission) -Non-invasive angiography showed multifocal atherosclerosis but only posterior circulation lesions are mild plaque in vertebral artery, suspected embolic source -Echocardiogram showed no cardiogenic source of embolism -Carotid imaging unremarkable   -Lipids ordered:  discharged on atorvastatin -Aspirin ordered at admission --> discharged on aspirin 325 mg daily plus Plavix for 3 months -Atrial fibrillation: not present on monitoring, recommend outaptient monitor -tPA not given because symptoms resolved by presentation -Dysphagia screen ordered in ER -PT eval ordered: recommended HHPT -Smoking cessation: not pertinent   PVD (peripheral vascular disease) (HCC)- (present on admission) On work up for secondary causes of stroke, patient noted to have asymptomatic stenosis of right ICA (80% by CT angiogram, <50% by ultrasound). - Follow up ultrasound and CTA findings with Vascular Surgery as outpatient  SVT (supraventricular tachycardia) (HCC)- (present on admission) Periodic non-atrial fibrillation SVTs in the hospital.  Evaluated by Cardiology, Echo unremarkable, started on verapamil 120, has follow up with Cardiology. - Neurology recommend outpatient cardiac monitor  Near syncope- (present on admission) Either from SVT or more likely from stroke.  No further episodes in hospital.  Worked with PT without symptoms of dizziness.  Dementia without behavioral disturbance (HCC)- (present on admission)    Hypercholesterolemia- (present on admission)    CAD (coronary artery disease)- (present on admission)    Essential hypertension- (present on admission)          Consultants: Neurology, Cardiology Procedures performed: Echo, carotid US, MRI brain, CTA head and neck  Disposition: Home health Diet recommendation: Cardiac diet  DISCHARGE MEDICATION: Allergies as of 09/02/2021       Reactions   Lisinopril Cough        Medication List     STOP taking these medications    amLODipine 5 MG tablet Commonly known as: NORVASC   methocarbamol 500  MG tablet Commonly known as: ROBAXIN   predniSONE 10 MG (48) Tbpk tablet Commonly known as: STERAPRED UNI-PAK 48 TAB   traMADol 50 MG tablet Commonly known as: ULTRAM       TAKE these  medications    aspirin EC 325 MG tablet Take 1 tablet (325 mg total) by mouth daily. What changed:  medication strength how much to take   atorvastatin 40 MG tablet Commonly known as: LIPITOR Take 1 tablet (40 mg total) by mouth daily. What changed:  medication strength how much to take   clopidogrel 75 MG tablet Commonly known as: Plavix Take 1 tablet (75 mg total) by mouth daily.   metoprolol succinate 25 MG 24 hr tablet Commonly known as: TOPROL-XL Take 25 mg by mouth daily.   traZODone 50 MG tablet Commonly known as: DESYREL Take 50 mg by mouth at bedtime.   verapamil 120 MG CR tablet Commonly known as: CALAN-SR Take 1 tablet (120 mg total) by mouth daily.        Follow-up Information     Lonell Face, MD. Schedule an appointment as soon as possible for a visit in 1 month(s).   Specialty: Neurology Why: For stroke neurology follow up Contact information: [address] [facility] [place] [address] [phone]         Orene Desanctis, MD. Schedule an appointment as soon as possible for a visit in 1 week(s).   Specialty: Pediatrics Contact information: [address] [place] [address] [phone]         Antonieta Iba, MD. Schedule an appointment as soon as possible for a visit in 2 week(s).   Specialty: Cardiology Why: FOr follow up of heart rate and new heart medicine Contact information: [address] [place] [address] [phone]                Discharge Instructions     Diet - low sodium heart healthy   Complete by: As directed    Discharge instructions   Complete by: As directed    From Dr. Maryfrances Bunnell: You were evaluated for dizzy spells and slurred speech, and here, we found that you had had a small stroke. Thankfully, the symptoms are not severe. You had a CT scan of your head and neck arteries, that showed that likely the cause was fatty plaque buildup in the the  arteries of the back of the head. The best way to treat this is with medicines to "grease the blood cells" like aspirin and Plavix. Also to take a stronger cholesterol medicine  Continue taking aspirin but increase to 325 mg daily  For the next three months, take clopidogrel/Plavix 75 mg daily  It is like a "super aspirin"  Call the stroke neurologist, Dr. Cristopher Peru for a follow up appointment in 4-6 weeks  ALSO: increase you cholesterol medicine Lipitor (from 20 mg daily to 40 mg daily)   We also found that the electrical activity of your heart is going fast again (this happened before after a surgery you had)  Continue your home metoprolol Start the new medicine verapamil CR 120 mg daily starting tonight Go see Dr. Mariah Milling in 2 weeks Dr. Mariah Milling will set you up with a heart monitor as an outpatient ALSO:  The new medicine verapamil is similar to your old medicine amlodipine/Norvasc.  So when you start verapamil, you can stop Norvasc.   Increase activity slowly   Complete by: As directed  Discharge Exam: Filed Weights   09/01/21 0935  Weight: 76 kg   General: Pt is alert, awake, not in acute distress, sitting up in bed Cardiovascular: RRR, nl S1-S2, no murmurs appreciated.   No LE edema.  At times brief tachycardia noted, asymptomatic. Respiratory: Normal respiratory rate and rhythm.  CTAB without rales or wheezes. Abdominal: Abdomen soft and non-tender.  No distension or HSM.   Neuro/Psych: Strength symmetric in upper and lower extremities.  Judgment and insight appear mildly impaired by dementia, but at baseline.   Condition at discharge: Good  The results of significant diagnostics from this hospitalization (including imaging, microbiology, ancillary and laboratory) are listed below for reference.   Imaging Studies: CT ANGIO HEAD NECK W WO CM  Result Date: 09/02/2021 CLINICAL DATA:  Stroke, follow up acute punctate R cerebellar stroke. EXAM: CT ANGIOGRAPHY HEAD  AND NECK TECHNIQUE: Multidetector CT imaging of the head and neck was performed using the standard protocol during bolus administration of intravenous contrast. Multiplanar CT image reconstructions and MIPs were obtained to evaluate the vascular anatomy. Carotid stenosis measurements (when applicable) are obtained utilizing NASCET criteria, using the distal internal carotid diameter as the denominator. RADIATION DOSE REDUCTION: This exam was performed according to the departmental dose-optimization program which includes automated exposure control, adjustment of the mA and/or kV according to patient size and/or use of iterative reconstruction technique. CONTRAST:  75mL OMNIPAQUE IOHEXOL 350 MG/ML SOLN COMPARISON:  CT head 09/01/2021 FINDINGS: CT HEAD Brain: There is no acute intracranial hemorrhage, mass effect, or edema. Gray-white differentiation is preserved. There is no extra-axial fluid collection. Ventricles and sulci are stable in size and configuration. Asymmetric left temporal volume loss. Stable findings of probable chronic microvascular ischemic changes. Small chronic infarcts of the left thalamus, right basal ganglia, and right cerebellum again identified. The small recent right cerebellar infarct on MRI is not visible by CT. Vascular: No new abnormality. Skull: Calvarium is unremarkable. Sinuses/Orbits: No acute finding. Other: None. Review of the MIP images confirms the above findings CTA NECK Aortic arch: Mild calcified plaque along aortic arch. Great vessel origins are patent. Right carotid system: Patent. Mixed plaque at the ICA origin causing 80% stenosis. Left carotid system: Patent. Partial obscuration the proximal common carotid by artifact from adjacent venous contrast. Calcified plaque at the ICA origin with less than 50% stenosis. Vertebral arteries: Patent. Right vertebral is dominant. No stenosis. Skeleton: Degenerative changes of the included spine. Other neck: Unremarkable. Upper chest:  No apical lung mass. Review of the MIP images confirms the above findings CTA HEAD Anterior circulation: Intracranial internal carotid arteries are patent. Calcified plaque is present. There is up to marked stenosis on the right at the proximal genu of the cavernous portion. Up to moderate stenosis on the left in the supraclinoid region. Anterior cerebral arteries are patent. 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 Anterior communicating artery is present. Middle cerebral arteries are patent. Moderate stenosis mid left M1 MCA. Posterior circulation: Intracranial vertebral arteries are patent. There is focal plaque along the proximal left vertebral near the PICA origin with stenosis of already congenitally small caliber vessel. Plaque along the right vertebral causes mild stenosis. Basilar artery is patent. Bilateral posterior communicating arteries are present. Posterior cerebral arteries are patent. Venous sinuses: Patent as allowed by contrast bolus timing. Review of the MIP images confirms the above findings IMPRESSION: No acute intracranial abnormality. Small right cerebellar infarct on MRI is not visible. Mixed plaque at the right ICA origin causing 80% stenosis. Plaque at  the left ICA origin causes less than 50% stenosis. Bilateral intracranial ICA atherosclerosis. Up to marked stenosis on the right and moderate stenosis on the left. Moderate stenosis mid left M1 MCA. Electronically Signed   By: Guadlupe Spanish M.D.   On: 09/02/2021 12:18   DG Abd 1 View  Result Date: 09/02/2021 CLINICAL DATA:  MRI clearance. EXAM: ABDOMEN - 1 VIEW COMPARISON:  None. FINDINGS: The bowel gas pattern is normal. No radio-opaque calculi or other significant radiographic abnormality are seen. An intact total right hip replacement is seen. Radiopaque contrast is seen within the urinary bladder and renal collecting system on the left. A partially calcified uterine fibroid is suspected on the left. No radiopaque soft tissue foreign  bodies are identified. IMPRESSION: 1. No radiopaque soft tissue foreign bodies identified. 2. Intact total right hip replacement. Electronically Signed   By: Aram Candela M.D.   On: 09/02/2021 00:08   CT HEAD WO CONTRAST ( )  Result Date: 09/01/2021 CLINICAL DATA:  Syncope.  History of dementia. EXAM: CT HEAD WITHOUT CONTRAST TECHNIQUE: Contiguous axial images were obtained from the base of the skull through the vertex without intravenous contrast. RADIATION DOSE REDUCTION: This exam was performed according to the departmental dose-optimization program which includes automated exposure control, adjustment of the mA and/or kV according to patient size and/or use of iterative reconstruction technique. COMPARISON:  Head CT, 09/17/2020. FINDINGS: Brain: No evidence of acute infarction, hemorrhage, hydrocephalus, extra-axial collection or mass lesion/mass effect. Ventricular and sulcal enlargement reflecting volume loss, without change from the prior study. Old left thalamic and right basal ganglia lacunar infarcts. Patchy areas of white matter hypoattenuation are noted consistent with moderate chronic microvascular ischemic change. These findings are stable. Vascular: No hyperdense vessel or unexpected calcification. Skull: Normal. Negative for fracture or focal lesion. Sinuses/Orbits: Visualized globes and orbits are unremarkable. Visualized sinuses are clear. Other: None. IMPRESSION: 1. No acute intracranial abnormalities. 2. Age related volume loss, old lacunar infarcts and moderate chronic microvascular ischemic change. Electronically Signed   By: Amie Portland M.D.   On: 09/01/2021 17:28   CT Angio Chest PE W and/or Wo Contrast  Result Date: 09/01/2021 CLINICAL DATA:  Syncope. History of dementia. PACs on cardiac monitoring. EXAM: CT ANGIOGRAPHY CHEST WITH CONTRAST TECHNIQUE: Multidetector CT imaging of the chest was performed using the standard protocol during bolus administration of intravenous  contrast. Multiplanar CT image reconstructions and MIPs were obtained to evaluate the vascular anatomy. RADIATION DOSE REDUCTION: This exam was performed according to the departmental dose-optimization program which includes automated exposure control, adjustment of the mA and/or kV according to patient size and/or use of iterative reconstruction technique. CONTRAST:  75mL OMNIPAQUE IOHEXOL 350 MG/ML SOLN COMPARISON:  02/19/2020. FINDINGS: Cardiovascular: Pulmonary arteries are well opacified. There is no evidence of a pulmonary embolism. Heart is mildly enlarged. Trace pericardial effusion. Left and right coronary artery calcifications. Great vessels are normal in caliber. Aorta is not opacified. There are aortic atherosclerotic calcifications. Mediastinum/Nodes: No neck base, mediastinal or hilar masses or pathologically enlarged lymph nodes. Trachea and esophagus are unremarkable. Lungs/Pleura: Minor subsegmental atelectasis at the lung bases. 3 mm nodule, right upper lobe, subpleural, image 26, series 6, stable from the prior CT, benign. No other nodules. Lungs otherwise clear. Stable eventration of the left hemidiaphragm. No pleural effusion or pneumothorax. Upper Abdomen: No acute abnormality. Musculoskeletal: No fracture or acute finding. No bone lesion. No chest wall masses. Review of the MIP images confirms the above findings. IMPRESSION: 1. No evidence of  a pulmonary embolism. 2. No acute findings.  No evidence of pneumonia or pulmonary edema. 3. Aortic arthrosclerosis. Two vessel coronary artery calcification, mild cardiomegaly and trace pericardial effusion. Aortic Atherosclerosis (ICD10-I70.0). Electronically Signed   By: Amie Portland M.D.   On: 09/01/2021 17:35   MR BRAIN WO CONTRAST  Result Date: 09/02/2021 CLINICAL DATA:  Transient ischemic attack EXAM: MRI HEAD WITHOUT CONTRAST TECHNIQUE: Multiplanar, multiecho pulse sequences of the brain and surrounding structures were obtained without  intravenous contrast. COMPARISON:  None. FINDINGS: Brain: Punctate focus of acute ischemia in the right cerebellum. No other diffusion abnormality. No acute or chronic hemorrhage. Hyperintense T2-weighted signal is moderately widespread throughout the white matter. Generalized volume loss without a clear lobar predilection. Old bilateral cerebellar and deep gray nuclei small vessel infarcts. The midline structures are normal. Vascular: Major flow voids are preserved. Skull and upper cervical spine: Normal calvarium and skull base. Visualized upper cervical spine and soft tissues are normal. Sinuses/Orbits:No paranasal sinus fluid levels or advanced mucosal thickening. No mastoid or middle ear effusion. Normal orbits. IMPRESSION: 1. Punctate focus of acute ischemia in the right cerebellum. No hemorrhage or mass effect. 2. Old bilateral cerebellar and deep gray nuclei small vessel infarcts. Electronically Signed   By: Deatra Robinson M.D.   On: 09/02/2021 01:03   US Carotid Bilateral  Result Date: 09/02/2021 CLINICAL DATA:  TIA.  Hypertension, syncope, hyperlipidemia EXAM: BILATERAL CAROTID DUPLEX ULTRASOUND TECHNIQUE: Wallace Cullens scale imaging, color Doppler and duplex ultrasound were performed of bilateral carotid and vertebral arteries in the neck. COMPARISON:  None. FINDINGS: Criteria: Quantification of carotid stenosis is based on velocity parameters that correlate the residual internal carotid diameter with NASCET-based stenosis levels, using the diameter of the distal internal carotid lumen as the denominator for stenosis measurement. The following velocity measurements were obtained: RIGHT ICA: 96/25 cm/sec CCA: 34/10 cm/sec SYSTOLIC ICA/CCA RATIO:  2.8 ECA: 79 cm/sec LEFT ICA: 58/20 cm/sec CCA: 41/10 cm/sec SYSTOLIC ICA/CCA RATIO:  1.4 ECA: 41/10 cm/sec RIGHT CAROTID ARTERY: Mild tortuosity. Partially calcified eccentric somewhat irregular plaque in the bulb and proximal ICA and ECA, resulting in at least mild  stenosis. Normal waveforms and color Doppler signal throughout. RIGHT VERTEBRAL ARTERY:  Normal flow direction and waveform. LEFT CAROTID ARTERY: Intimal thickening in the common carotid artery. Mild eccentric plaque in the bulb, and proximal ICA and ECA, resulting in at least mild stenosis. LEFT VERTEBRAL ARTERY:  Normal flow direction and waveform. IMPRESSION: 1. Bilateral carotid bifurcation plaque resulting in less than 50% diameter ICA stenosis. 2. Antegrade bilateral vertebral arterial flow. Electronically Signed   By: Corlis Leak M.D.   On: 09/02/2021 10:20   DG Chest Portable 1 View  Result Date: 09/01/2021 CLINICAL DATA:  Shortness of breath. EXAM: PORTABLE CHEST 1 VIEW COMPARISON:  September 17, 2020 FINDINGS: Large hiatal hernia.  Chronic elevation of left hemidiaphragm. Cardiomediastinal silhouette is normal. Mediastinal contours appear intact. There is no evidence of focal airspace consolidation, pleural effusion or pneumothorax. Osseous structures are without acute abnormality. Soft tissues are grossly normal. IMPRESSION: Large hiatal hernia. No evidence of active cardiopulmonary disease. Electronically Signed   By: Ted Mcalpine M.D.   On: 09/01/2021 16:29   ECHOCARDIOGRAM COMPLETE  Result Date: 09/02/2021    ECHOCARDIOGRAM REPORT   Patient Name:   Kelly Richards Date of Exam: 09/02/2021 Medical Rec #:  [id number]    Height:       65.0 in Accession #:    [id number]   Weight:  167.5 lb Date of Birth:  Jan 24, 1930    BSA:          1.835 m Patient Age:    88 years     BP:           143/102 mmHg Patient Gender: F            HR:           80 bpm. Exam Location:  [facility] Procedure: 2D Echo Indications:     TIA  History:         Patient has no prior history of Echocardiogram examinations.                  Risk Factors:Hypertension and Dyslipidemia.  Sonographer:     L Thornton-Maynard Referring Phys:  [id number] CRISTESCU Diagnosing Phys: Debbe Odea MD IMPRESSIONS  1. Left ventricular  ejection fraction, by estimation, is 55 to 60%. The left ventricle has normal function. The left ventricle has no regional wall motion abnormalities. There is severe asymmetric left ventricular hypertrophy of the septal segment. Left  ventricular diastolic parameters are consistent with Grade I diastolic dysfunction (impaired relaxation).  2. Right ventricular systolic function is normal. The right ventricular size is normal. There is normal pulmonary artery systolic pressure.  3. Left atrial size was mildly dilated.  4. The mitral valve is degenerative. Mild mitral valve regurgitation.  5. Aortic valve doppler images suboptimal for adequate assessment of aortic valve hemodynamics.. The aortic valve is bicuspid. Aortic valve regurgitation is not visualized. Mild to moderate aortic valve stenosis.  6. The inferior vena cava is normal in size with <50% respiratory variability, suggesting right atrial pressure of 8 mmHg. FINDINGS  Left Ventricle: Left ventricular ejection fraction, by estimation, is 55 to 60%. The left ventricle has normal function. The left ventricle has no regional wall motion abnormalities. The left ventricular internal cavity size was normal in size. There is  severe asymmetric left ventricular hypertrophy of the septal segment. Left ventricular diastolic parameters are consistent with Grade I diastolic dysfunction (impaired relaxation). Right Ventricle: The right ventricular size is normal. No increase in right ventricular wall thickness. Right ventricular systolic function is normal. There is normal pulmonary artery systolic pressure. The tricuspid regurgitant velocity is 2.28 m/s, and  with an assumed right atrial pressure of 3 mmHg, the estimated right ventricular systolic pressure is 23.8 mmHg. Left Atrium: Left atrial size was mildly dilated. Right Atrium: Right atrial size was normal in size. Pericardium: There is no evidence of pericardial effusion. Mitral Valve: The mitral valve is  degenerative in appearance. Mild to moderate mitral annular calcification. Mild mitral valve regurgitation. MV peak gradient, 5.7 mmHg. The mean mitral valve gradient is 1.0 mmHg. Tricuspid Valve: The tricuspid valve is not well visualized. Tricuspid valve regurgitation is not demonstrated. Aortic Valve: Aortic valve doppler images suboptimal for adequate assessment of aortic valve hemodynamics. The aortic valve is bicuspid. Aortic valve regurgitation is not visualized. Mild to moderate aortic stenosis is present. Aortic valve mean gradient  measures 14.0 mmHg. Aortic valve peak gradient measures 25.0 mmHg. Aortic valve area, by VTI measures 0.72 cm. Pulmonic Valve: The pulmonic valve was not well visualized. Pulmonic valve regurgitation is trivial. Aorta: The aortic root and ascending aorta are structurally normal, with no evidence of dilitation. Venous: The inferior vena cava is normal in size with less than 50% respiratory variability, suggesting right atrial pressure of 8 mmHg. IAS/Shunts: No atrial level shunt detected by color flow Doppler.  LEFT VENTRICLE PLAX 2D LVIDd:         3.45 cm     Diastology LVIDs:         2.50 cm     LV e' medial:    1.80 cm/s LV PW:         1.30 cm     LV E/e' medial:  23.1 LV IVS:        1.70 cm     LV e' lateral:   4.95 cm/s LVOT diam:     1.80 cm     LV E/e' lateral: 8.4 LV SV:         35 LV SV Index:   19 LVOT Area:     2.54 cm  LV Volumes (MOD) LV vol d, MOD A2C: 37.1 ml LV vol d, MOD A4C: 23.8 ml LV vol s, MOD A2C: 9.7 ml LV vol s, MOD A4C: 7.5 ml LV SV MOD A2C:     27.4 ml LV SV MOD A4C:     23.8 ml LV SV MOD BP:      21.8 ml RIGHT VENTRICLE             IVC RV S prime:     12.30 cm/s  IVC diam: 1.20 cm TAPSE (M-mode): 1.5 cm LEFT ATRIUM             Index LA diam:        3.30 cm 1.80 cm/m LA Vol (A2C):   53.1 ml 28.94 ml/m LA Vol (A4C):   59.5 ml 32.43 ml/m LA Biplane Vol: 58.4 ml 31.83 ml/m  AORTIC VALVE                     PULMONIC VALVE AV Area (Vmax):    0.66 cm       PV Vmax:       0.90 m/s AV Area (Vmean):   0.73 cm      PV Peak grad:  3.2 mmHg AV Area (VTI):     0.72 cm AV Vmax:           250.00 cm/s AV Vmean:          173.000 cm/s AV VTI:            0.480 m AV Peak Grad:      25.0 mmHg AV Mean Grad:      14.0 mmHg LVOT Vmax:         65.30 cm/s LVOT Vmean:        49.300 cm/s LVOT VTI:          0.136 m LVOT/AV VTI ratio: 0.28  AORTA Ao Root diam: 3.30 cm Ao Asc diam:  3.20 cm MITRAL VALVE                TRICUSPID VALVE MV Area (PHT): 2.18 cm     TR Peak grad:   20.8 mmHg MV Area VTI:   1.34 cm     TR Vmax:        228.00 cm/s MV Peak grad:  5.7 mmHg MV Mean grad:  1.0 mmHg     SHUNTS MV Vmax:       1.19 m/s     Systemic VTI:  0.14 m MV Vmean:      52.8 cm/s    Systemic Diam: 1.80 cm MV Decel Time: 348 msec MV E velocity: 41.60 cm/s MV A velocity: 105.00 cm/s MV E/A ratio:  0.40 Debbe Odea MD Electronically  signed by Debbe OdeaBrian Agbor-Etang MD Signature Date/Time: 09/02/2021/9:31:41 AM    Final     Microbiology: Results for orders placed or performed during the hospital encounter of 09/01/21  Resp Panel by RT-PCR (Flu A&B, Covid) Nasopharyngeal Swab     Status: None   Collection Time: 09/01/21  3:37 PM   Specimen: Nasopharyngeal Swab; Nasopharyngeal(NP) swabs in vial transport medium  Result Value Ref Range Status   SARS Coronavirus 2 by RT PCR NEGATIVE NEGATIVE Final    Comment: (NOTE) SARS-CoV-2 target nucleic acids are NOT DETECTED.  The SARS-CoV-2 RNA is generally detectable in upper respiratory specimens during the acute phase of infection. The lowest concentration of SARS-CoV-2 viral copies this assay can detect is 138 copies/mL. A negative result does not preclude SARS-Cov-2 infection and should not be used as the sole basis for treatment or other patient management decisions. A negative result may occur with  improper specimen collection/handling, submission of specimen other than nasopharyngeal swab, presence of viral mutation(s) within  the areas targeted by this assay, and inadequate number of viral copies(<138 copies/mL). A negative result must be combined with clinical observations, patient history, and epidemiological information. The expected result is Negative.  Fact Sheet for Patients:  BloggerCourse.comhttps://www.fda.gov/media/152166/download  Fact Sheet for Healthcare Providers:  SeriousBroker.ithttps://www.fda.gov/media/152162/download  This test is no t yet approved or cleared by the [place] FDA and  has been authorized for detection and/or diagnosis of SARS-CoV-2 by FDA under an Emergency Use Authorization (EUA). This EUA will remain  in effect (meaning this test can be used) for the duration of the COVID-19 declaration under Section 564(b)(1) of the Act, 21 U.S.C.section 360bbb-3(b)(1), unless the authorization is terminated  or revoked sooner.       Influenza A by PCR NEGATIVE NEGATIVE Final   Influenza B by PCR NEGATIVE NEGATIVE Final    Comment: (NOTE) The Xpert Xpress SARS-CoV-2/FLU/RSV plus assay is intended as an aid in the diagnosis of influenza from Nasopharyngeal swab specimens and should not be used as a sole basis for treatment. Nasal washings and aspirates are unacceptable for Xpert Xpress SARS-CoV-2/FLU/RSV testing.  Fact Sheet for Patients: BloggerCourse.comhttps://www.fda.gov/media/152166/download  Fact Sheet for Healthcare Providers: SeriousBroker.ithttps://www.fda.gov/media/152162/download  This test is not yet approved or cleared by the [place] FDA and has been authorized for detection and/or diagnosis of SARS-CoV-2 by FDA under an Emergency Use Authorization (EUA). This EUA will remain in effect (meaning this test can be used) for the duration of the COVID-19 declaration under Section 564(b)(1) of the Act, 21 U.S.C. section 360bbb-3(b)(1), unless the authorization is terminated or revoked.  Performed at [facility], [address] [place], [place] [id number]     Labs: CBC: Recent Labs  Lab 09/01/21 1005  09/02/21 0645  WBC 5.0 5.3  HGB 12.9 13.4  HCT 40.0 40.8  MCV 95.0 92.1  PLT 240 241   Basic Metabolic Panel: Recent Labs  Lab 09/01/21 1005 09/01/21 1013 09/02/21 0645  NA 138  --  137  K 3.8  --  3.5  CL 109  --  108  CO2 22  --  20*  GLUCOSE 93  --  103*  BUN 16  --  12  CREATININE 0.77  --  0.73  CALCIUM 9.1  --  9.1  MG  --  2.2  --    Liver Function Tests: Recent Labs  Lab 09/02/21 0645  AST 13*  ALT 9  ALKPHOS 62  BILITOT 0.9  PROT 6.6  ALBUMIN 3.7  CBG: No results for input(s): GLUCAP in the last 168 hours.  Discharge time spent: 50 minutes.  Signed: Alberteen Sam, MD [facility] 09/02/2021

## 2021-09-02 NOTE — Assessment & Plan Note (Signed)
On work up for secondary causes of stroke, patient noted to have asymptomatic stenosis of right ICA (80% by CT angiogram, <50% by ultrasound). - Follow up ultrasound and CTA findings with Vascular Surgery as outpatient

## 2021-09-02 NOTE — ED Notes (Signed)
Pt alert at this time.  This RN assisted pt to bed and hooked to monitor after toileting.

## 2021-10-02 ENCOUNTER — Encounter (INDEPENDENT_AMBULATORY_CARE_PROVIDER_SITE_OTHER): Payer: Medicare HMO | Admitting: Vascular Surgery

## 2021-10-04 ENCOUNTER — Ambulatory Visit: Payer: Medicare HMO | Admitting: Cardiology

## 2021-10-05 ENCOUNTER — Encounter: Payer: Self-pay | Admitting: Cardiology

## 2022-04-02 IMAGING — CT CT ANGIO HEAD-NECK (W OR W/O PERF)
1 of 11 series · 5 of 33 positions shown · non-contrast
Comparison: CT head 09/01/2021

CLINICAL DATA: Stroke, follow up acute punctate R cerebellar
stroke.

EXAM:
CT ANGIOGRAPHY HEAD AND NECK
TECHNIQUE: Multidetector CT imaging of the head and neck was performed using
the standard protocol during bolus administration of intravenous
contrast. Multiplanar CT image reconstructions and MIPs were
obtained to evaluate the vascular anatomy. Carotid stenosis
measurements (when applicable) are obtained utilizing NASCET
criteria, using the distal internal carotid diameter as the
denominator.

[Series 10: ax thin · axial · 0.55mm/px · z∈[-355,-102]mm · 5 of 415 slices shown]
[im 70/415  soft-tissue]
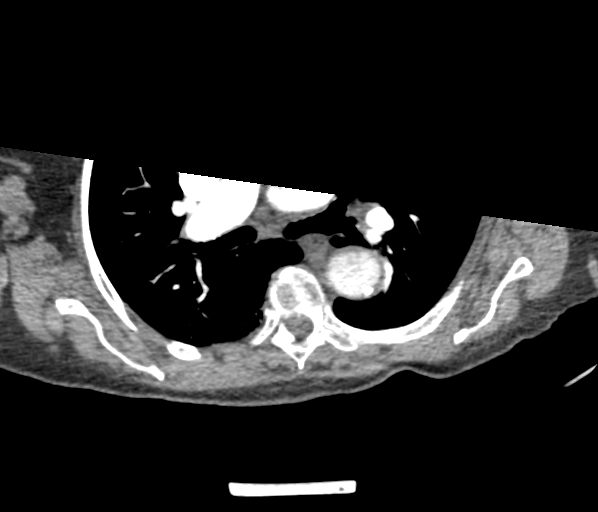
[im 139/415  bone]
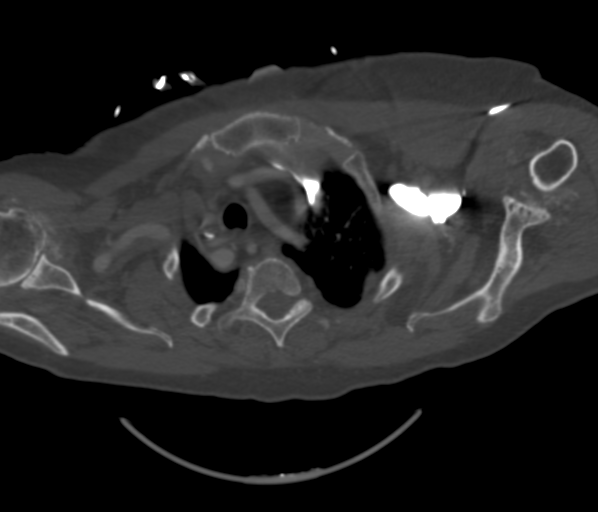
[im 208/415  soft-tissue]
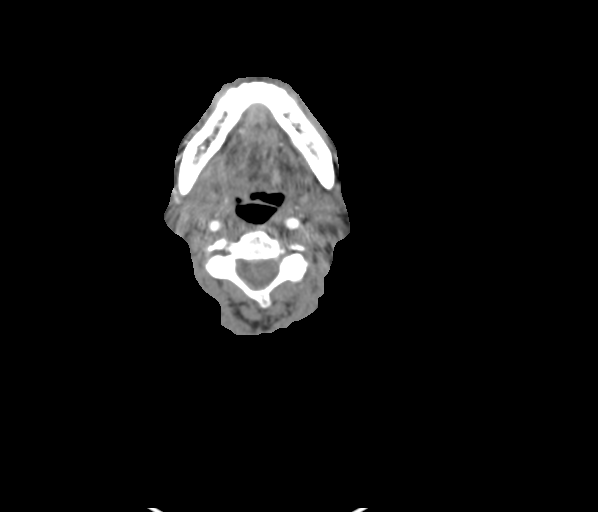
[im 277/415  bone]
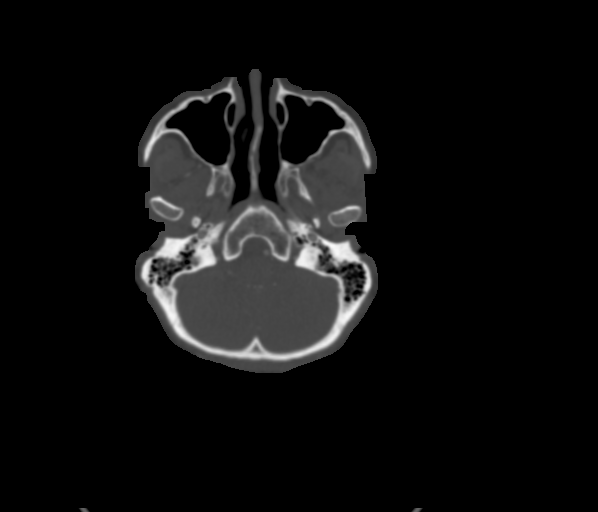
[im 346/415  soft-tissue]
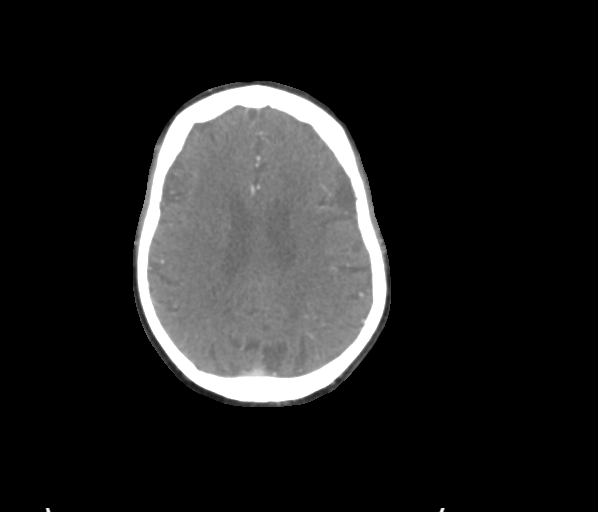

[5 of 33 positions shown; findings below may reference images not displayed]

RADIATION DOSE REDUCTION: This exam was performed according to the
departmental dose-optimization program which includes automated
exposure control, adjustment of the mA and/or kV according to
patient size and/or use of iterative reconstruction technique.

CONTRAST:  75mL OMNIPAQUE IOHEXOL 350 MG/ML SOLN
FINDINGS: CT HEAD

Brain: There is no acute intracranial hemorrhage, mass effect, or
edema. Gray-white differentiation is preserved. There is no
extra-axial fluid collection. Ventricles and sulci are stable in
size and configuration. Asymmetric left temporal volume loss. Stable
findings of probable chronic microvascular ischemic changes. Small
chronic infarcts of the left thalamus, right basal ganglia, and
right cerebellum again identified. The small recent right cerebellar
infarct on MRI is not visible by CT.

Vascular: No new abnormality.

Skull: Calvarium is unremarkable.

Sinuses/Orbits: No acute finding.

Other: None.

Review of the MIP images confirms the above findings

CTA NECK

Aortic arch: Mild calcified plaque along aortic arch. Great vessel
origins are patent.

Right carotid system: Patent. Mixed plaque at the ICA origin causing
80% stenosis.

Left carotid system: Patent. Partial obscuration the proximal common
carotid by artifact from adjacent venous contrast. Calcified plaque
at the ICA origin with less than 50% stenosis.

Vertebral arteries: Patent. Right vertebral is dominant. No
stenosis.

Skeleton: Degenerative changes of the included spine.

Other neck: Unremarkable.

Upper chest: No apical lung mass.

Review of the MIP images confirms the above findings

CTA HEAD

Anterior circulation: Intracranial internal carotid arteries are
patent. Calcified plaque is present. There is up to marked stenosis
on the right at the proximal genu of the cavernous portion. Up to
moderate stenosis on the left in the supraclinoid region. Anterior
cerebral arteries are patent. Right A1 ACA is congenitally absent.
Anterior communicating artery is present. Middle cerebral arteries
are patent. Moderate stenosis mid left M1 MCA.

Posterior circulation: Intracranial vertebral arteries are patent.
There is focal plaque along the proximal left vertebral near the
PICA origin with stenosis of already congenitally small caliber
vessel. Plaque along the right vertebral causes mild stenosis.
Basilar artery is patent. Bilateral posterior communicating arteries
are present. Posterior cerebral arteries are patent.

Venous sinuses: Patent as allowed by contrast bolus timing.

Review of the MIP images confirms the above findings
IMPRESSION: No acute intracranial abnormality. Small right cerebellar infarct on
MRI is not visible.

Mixed plaque at the right ICA origin causing 80% stenosis. Plaque at
the left ICA origin causes less than 50% stenosis.

Bilateral intracranial ICA atherosclerosis. Up to marked stenosis on
the right and moderate stenosis on the left.

Moderate stenosis mid left M1 MCA.

## 2022-04-03 ENCOUNTER — Other Ambulatory Visit: Payer: Self-pay

## 2022-04-03 ENCOUNTER — Encounter: Payer: Self-pay | Admitting: Emergency Medicine

## 2022-04-03 DIAGNOSIS — R112 Nausea with vomiting, unspecified: Secondary | ICD-10-CM | POA: Insufficient documentation

## 2022-04-03 DIAGNOSIS — Z96649 Presence of unspecified artificial hip joint: Secondary | ICD-10-CM | POA: Insufficient documentation

## 2022-04-03 DIAGNOSIS — F0390 Unspecified dementia without behavioral disturbance: Secondary | ICD-10-CM | POA: Diagnosis not present

## 2022-04-03 DIAGNOSIS — R1084 Generalized abdominal pain: Secondary | ICD-10-CM | POA: Insufficient documentation

## 2022-04-03 DIAGNOSIS — Z79899 Other long term (current) drug therapy: Secondary | ICD-10-CM | POA: Diagnosis not present

## 2022-04-03 DIAGNOSIS — I10 Essential (primary) hypertension: Secondary | ICD-10-CM | POA: Diagnosis not present

## 2022-04-03 DIAGNOSIS — Z7982 Long term (current) use of aspirin: Secondary | ICD-10-CM | POA: Diagnosis not present

## 2022-04-03 LAB — CBC
HCT: 39.8 % (ref 36.0–46.0)
Hemoglobin: 12.6 g/dL (ref 12.0–15.0)
MCH: 29.6 pg (ref 26.0–34.0)
MCHC: 31.7 g/dL (ref 30.0–36.0)
MCV: 93.4 fL (ref 80.0–100.0)
Platelets: 292 10*3/uL (ref 150–400)
RBC: 4.26 MIL/uL (ref 3.87–5.11)
RDW: 13.3 % (ref 11.5–15.5)
WBC: 5.8 10*3/uL (ref 4.0–10.5)
nRBC: 0 % (ref 0.0–0.2)

## 2022-04-03 LAB — COMPREHENSIVE METABOLIC PANEL
ALT: 11 U/L (ref 0–44)
AST: 16 U/L (ref 15–41)
Albumin: 4.1 g/dL (ref 3.5–5.0)
Alkaline Phosphatase: 69 U/L (ref 38–126)
Anion gap: 8 (ref 5–15)
BUN: 25 mg/dL — ABNORMAL HIGH (ref 8–23)
CO2: 21 mmol/L — ABNORMAL LOW (ref 22–32)
Calcium: 9.4 mg/dL (ref 8.9–10.3)
Chloride: 111 mmol/L (ref 98–111)
Creatinine, Ser: 1.1 mg/dL — ABNORMAL HIGH (ref 0.44–1.00)
GFR, Estimated: 48 mL/min — ABNORMAL LOW (ref >60)
Glucose, Bld: 94 mg/dL (ref 70–99)
Potassium: 3.9 mmol/L (ref 3.5–5.1)
Sodium: 140 mmol/L (ref 135–145)
Total Bilirubin: 0.8 mg/dL (ref 0.3–1.2)
Total Protein: 7.2 g/dL (ref 6.5–8.1)

## 2022-04-03 LAB — LIPASE, BLOOD: Lipase: 31 U/L (ref 11–51)

## 2022-04-03 NOTE — ED Triage Notes (Signed)
Pt presents via [facility] with c/o abdominal pain that has been ongoing for 4 months. Pt reports they feel like "labor pains" and cramp like in nature. Pt reports pain worsens with defecation. Pt denies dysuria. Pt does endorse being nauseas.

## 2022-04-03 NOTE — ED Triage Notes (Signed)
First Nurse Note: Pt via EMS from home. Pt c/o lower abd pain for the past 3 months and got worse today. States she also been nauseous today.  Pt has a hx of dementia but at her baseline  180/104 77 HR 95% on RA

## 2022-04-04 ENCOUNTER — Emergency Department
Admission: EM | Admit: 2022-04-04 | Discharge: 2022-04-04 | Disposition: A | Discharge status: Home / Self Care | Payer: Medicare HMO | Attending: Emergency Medicine | Admitting: Emergency Medicine

## 2022-04-04 ENCOUNTER — Emergency Department: Payer: Medicare HMO

## 2022-04-04 DIAGNOSIS — R112 Nausea with vomiting, unspecified: Secondary | ICD-10-CM

## 2022-04-04 LAB — URINALYSIS, ROUTINE W REFLEX MICROSCOPIC
Bilirubin Urine: NEGATIVE
Glucose, UA: NEGATIVE mg/dL
Hgb urine dipstick: NEGATIVE
Ketones, ur: NEGATIVE mg/dL
Leukocytes,Ua: NEGATIVE
Nitrite: NEGATIVE
Protein, ur: NEGATIVE mg/dL
Specific Gravity, Urine: 1.041 — ABNORMAL HIGH (ref 1.005–1.030)
pH: 5 (ref 5.0–8.0)

## 2022-04-04 MED ORDER — FENTANYL CITRATE PF 50 MCG/ML IJ SOSY
50.0000 ug | PREFILLED_SYRINGE | Freq: Once | INTRAMUSCULAR | Status: AC
  Administered 2022-04-04: 50 ug via INTRAVENOUS
  Filled 2022-04-04: qty 1

## 2022-04-04 MED ORDER — IOHEXOL 300 MG/ML  SOLN
100.0000 mL | Freq: Once | INTRAMUSCULAR | Status: AC | PRN
  Administered 2022-04-04: 100 mL via INTRAVENOUS

## 2022-04-04 MED ORDER — ONDANSETRON 4 MG PO TBDP: 4.0000 mg | ORAL_TABLET | Freq: Four times a day (QID) | ORAL | 0 refills | Status: AC | PRN

## 2022-04-04 MED ORDER — ONDANSETRON HCL 4 MG/2ML IJ SOLN
4.0000 mg | Freq: Once | INTRAMUSCULAR | Status: AC
  Administered 2022-04-04: 4 mg via INTRAVENOUS
  Filled 2022-04-04: qty 2

## 2022-04-04 MED ORDER — SODIUM CHLORIDE 0.9 % IV SOLN: INTRAVENOUS | Status: DC

## 2022-04-04 NOTE — ED Provider Notes (Signed)
[facility] Provider Note    Event Date/Time   First MD Initiated Contact with Patient 04/04/22 0208     (approximate)   History   Abdominal Pain   HPI  Kelly Richards is a 88 y.o. female with history of dementia, hypertension, hyperlipidemia, previous hiatal hernia repair at [facility] who presents to the emergency department with her daughter for concerns for generalized abdominal pain, vomiting that started today.  Last bowel movement was yesterday.  No fevers.  No other abdominal surgeries.  No chest pain or difficulty breathing.  Daughter was concerned that she could have had recurrence of her hiatal hernia.  Patient still complaining of abdominal pain at this time.  States mostly in the mid to lower abdomen.   History provided by patient and daughter.    Past Medical History:  Diagnosis Date   Hyperlipidemia    Hypertension     Past Surgical History:  Procedure Laterality Date   TOTAL HIP ARTHROPLASTY      MEDICATIONS:  Prior to Admission medications   Medication Sig Start Date End Date Taking? Authorizing Provider  aspirin EC 325 MG tablet Take 1 tablet (325 mg total) by mouth daily. 09/02/21   Danford, Earl Lites, MD  atorvastatin (LIPITOR) 40 MG tablet Take 1 tablet (40 mg total) by mouth daily. 09/02/21   Danford, Earl Lites, MD  metoprolol succinate (TOPROL-XL) 25 MG 24 hr tablet Take 25 mg by mouth daily. 05/05/21   [provider]  traZODone (DESYREL) 50 MG tablet Take 50 mg by mouth at bedtime. 08/27/21   [provider]  verapamil (CALAN-SR) 120 MG CR tablet Take 1 tablet (120 mg total) by mouth daily. 09/02/21 09/02/22  Alberteen Sam, MD    Physical Exam   Triage Vital Signs: ED Triage Vitals  Enc Vitals Group     BP 04/03/22 1918 (!) 149/96     Pulse Rate 04/03/22 1918 78     Resp 04/03/22 1918 17     Temp 04/03/22 1918 98.4 F (36.9 C)     Temp Source 04/04/22 0202 Oral     SpO2 04/03/22 1918 97 %      Weight --      Height 04/03/22 1918 5\' 5"  (1.651 m)     Head Circumference --      Peak Flow --      Pain Score 04/03/22 1918 10     Pain Loc --      Pain Edu? --      Excl. in GC? --     Most recent vital signs: Vitals:   04/04/22 0330 04/04/22 0458  BP: (!) 160/100 (!) 151/48  Pulse: 62 (!) 56  Resp: 12 19  Temp:    SpO2: 92% 95%    CONSTITUTIONAL: Alert, patient with dementia, elderly. Well-appearing; well-nourished HEAD: Normocephalic, atraumatic EYES: Conjunctivae clear, pupils appear equal, sclera nonicteric ENT: normal nose; moist mucous membranes NECK: Supple, normal ROM CARD: RRR; S1 and S2 appreciated; no murmurs, no clicks, no rubs, no gallops RESP: Normal chest excursion without splinting or tachypnea; breath sounds clear and equal bilaterally; no wheezes, no rhonchi, no rales, no hypoxia or respiratory distress, speaking full sentences ABD/GI: Normal bowel sounds; non-distended; soft, mildly tender to palpation diffusely without guarding or rebound, no peritoneal signs BACK: The back appears normal EXT: Normal ROM in all joints; no deformity noted, no edema; no cyanosis SKIN: Normal color for age and race; warm; no rash on exposed skin  NEURO: Moves all extremities equally, normal speech PSYCH: The patient's mood and manner are appropriate.   ED Results / Procedures / Treatments   LABS: (all labs ordered are listed, but only abnormal results are displayed) Labs Reviewed  COMPREHENSIVE METABOLIC PANEL - Abnormal; Notable for the following components:      Result Value   CO2 21 (*)    BUN 25 (*)    Creatinine, Ser 1.10 (*)    GFR, Estimated 48 (*)    All other components within normal limits  URINALYSIS, ROUTINE W REFLEX MICROSCOPIC - Abnormal; Notable for the following components:   Color, Urine YELLOW (*)    APPearance CLEAR (*)    Specific Gravity, Urine 1.041 (*)    All other components within normal limits  LIPASE, BLOOD  CBC     EKG:  EKG  Interpretation  Date/Time:  Wednesday April 03 2022 19:18:50 EDT Ventricular Rate:  77 PR Interval:  140 QRS Duration: 84 QT Interval:  404 QTC Calculation: 457 R Axis:   -12 Text Interpretation: Normal sinus rhythm Minimal voltage criteria for LVH, may be normal variant ( Cornell product ) ST & T wave abnormality, consider inferolateral ischemia Abnormal ECG When compared with ECG of 01-Sep-2021 14:55, PREVIOUS ECG IS PRESENT No significant change since last tracing Confirmed by Rochele Raring [phone]) on 04/04/2022 2:13:27 AM         RADIOLOGY: My personal review and interpretation of imaging: CT shows no acute abnormality.  I have personally reviewed all radiology reports.   CT ABDOMEN PELVIS W CONTRAST  Result Date: 04/04/2022 CLINICAL DATA:  Abdominal pain. EXAM: CT ABDOMEN AND PELVIS WITH CONTRAST TECHNIQUE: Multidetector CT imaging of the abdomen and pelvis was performed using the standard protocol following bolus administration of intravenous contrast. RADIATION DOSE REDUCTION: This exam was performed according to the departmental dose-optimization program which includes automated exposure control, adjustment of the mA and/or kV according to patient size and/or use of iterative reconstruction technique. CONTRAST:  OMNIPAQUE IOHEXOL 300 MG/ML  SOLN COMPARISON:  February 19, 2020 FINDINGS: Lower chest: No acute abnormality. Hepatobiliary: A stable 1.1 cm cystic appearing area is seen within the anterolateral aspect of the liver dome. No gallstones, gallbladder wall thickening, or biliary dilatation. Pancreas: The pancreatic parenchyma is unremarkable, without evidence of peripancreatic inflammatory fat stranding. The pancreatic duct is dilated and measures 7.1 mm in diameter (measured approximately 6 mm on the prior study). Spleen: Normal in size without focal abnormality. Adrenals/Urinary Tract: Adrenal glands are unremarkable. Kidneys are normal in size, without renal calculi or  hydronephrosis. Stable bilateral simple renal cysts are seen. No additional follow-up or imaging is recommended. Bladder is unremarkable. Stomach/Bowel: Stomach is within normal limits. The large hiatal hernia is seen on the prior study is no longer present. Appendix appears normal. No evidence of bowel wall thickening, distention, or inflammatory changes. Noninflamed diverticula are seen throughout the large bowel. Vascular/Lymphatic: Aortic atherosclerosis with marked severity tortuosity of the abdominal aorta. No enlarged abdominal or pelvic lymph nodes. Reproductive: A stable 2.4 cm x 1.6 cm coarse parenchymal uterine calcification is seen. The bilateral adnexa are unremarkable. Other: No abdominal wall hernia or abnormality. No abdominopelvic ascites. Musculoskeletal: There is a total right hip replacement with associated streak artifact and subsequently limited evaluation of the adjacent osseous and soft tissue structures. Grade 1 anterolisthesis of the L4 vertebral body on L5 with multilevel degenerative changes seen throughout the lumbar spine. IMPRESSION: 1. Colonic diverticulosis. 2. Grade 1 anterolisthesis of the  L4 vertebral body on L5 with multilevel degenerative changes throughout the lumbar spine. 3. Total right hip replacement. Aortic Atherosclerosis (ICD10-I70.0). Electronically Signed   By: Aram Candela M.D.   On: 04/04/2022 04:17     PROCEDURES:  Critical Care performed: No     .1-3 Lead EKG Interpretation  Performed by: Humzah Harty, Layla Maw, DO Authorized by: Anthoni Geerts, Layla Maw, DO     Interpretation: normal     ECG rate:  78   ECG rate assessment: normal     Rhythm: sinus rhythm     Ectopy: none     Conduction: normal       IMPRESSION / MDM / ASSESSMENT AND PLAN / ED COURSE  I reviewed the triage vital signs and the nursing notes.    Here with abdominal pain, vomiting.  The patient is on the cardiac monitor to evaluate for evidence of arrhythmia and/or significant  heart rate changes.   DIFFERENTIAL DIAGNOSIS (includes but not limited to):   Recurrent hiatal hernia, GERD, bowel obstruction, colitis, diverticulitis, appendicitis, UTI, kidney stone, pyelonephritis, pancreatitis, biliary colic, cholecystitis   Patient's presentation is most consistent with acute presentation with potential threat to life or bodily function.   PLAN: We will obtain CBC, CMP, lipase, urinalysis, CT of the abdomen pelvis.  Will give IV fluids, pain and nausea medicine.  EKG is nonischemic.   MEDICATIONS GIVEN IN ED: Medications  0.9 %  sodium chloride infusion ( Intravenous New Bag/Given 04/04/22 0312)  fentaNYL (SUBLIMAZE) injection 50 mcg (50 mcg Intravenous Given 04/04/22 0312)  ondansetron (ZOFRAN) injection 4 mg (4 mg Intravenous Given 04/04/22 0312)  iohexol (OMNIPAQUE) 300 MG/ML solution 100 mL (100 mLs Intravenous Contrast Given 04/04/22 0348)     ED COURSE: Patient's labs show no leukocytosis.  Minimal elevation of her creatinine of 1.1.  LFTs and lipase normal.   4:26 AM  CT reviewed and interpreted by myself and the radiologist shows no acute abnormality.  Appendix is normal.  No recurrence of her hiatal hernia.  No bowel obstruction.  Will obtain urinalysis and p.o. challenge.  Patient states she is feeling better.   6:09 AM  Pt's urine shows no sign of infection or significant dehydration.  Patient is well-appearing, hemodynamically stable and tolerating p.o.  I feel she is safe for discharge home with further outpatient work-up if symptoms continue.  Will discharge with Zofran.  Recommended a bland diet.  Discussed return precautions with patient's daughter.   At this time, I do not feel there is any life-threatening condition present. I reviewed all nursing notes, vitals, pertinent previous records.  All lab and urine results, EKGs, imaging ordered have been independently reviewed and interpreted by myself.  I reviewed all available radiology reports from any  imaging ordered this visit.  Based on my assessment, I feel the patient is safe to be discharged home without further emergent workup and can continue workup as an outpatient as needed. Discussed all findings, treatment plan as well as usual and customary return precautions.  They verbalize understanding and are comfortable with this plan.  Outpatient follow-up has been provided as needed.  All questions have been answered.   CONSULTS: Admission considered but patient's work-up is reassuring and patient is feeling better, tolerating p.o.   OUTSIDE RECORDS REVIEWED: Reviewed patient's last office visit with family medicine on 09/20/2021.  Reviewed patient's previous surgical notes at [facility] in July 2021.       FINAL CLINICAL IMPRESSION(S) / ED DIAGNOSES   Final diagnoses:  Nausea and vomiting in adult     Rx / DC Orders   ED Discharge Orders          Ordered    ondansetron (ZOFRAN-ODT) 4 MG disintegrating tablet  Every 6 hours PRN        04/04/22 0426             Note:  This document was prepared using Dragon voice recognition software and may include unintentional dictation errors.   Siearra Amberg, Layla Maw, DO 04/04/22 [phone]

## 2022-07-19 DEATH — deceased
